# Patient Record
Sex: Female | Born: 1975 | Race: Black or African American | Hispanic: No | Marital: Single | Smoking: Never smoker
Health system: Southern US, Community
[De-identification: ages and names within clinical notes are randomized; demographics above are authoritative.]

## PROBLEM LIST (undated history)

## (undated) DIAGNOSIS — I10 Essential (primary) hypertension: Secondary | ICD-10-CM

## (undated) DIAGNOSIS — F419 Anxiety disorder, unspecified: Secondary | ICD-10-CM

## (undated) DIAGNOSIS — F32A Depression, unspecified: Secondary | ICD-10-CM

## (undated) DIAGNOSIS — J45909 Unspecified asthma, uncomplicated: Secondary | ICD-10-CM

## (undated) DIAGNOSIS — T7840XA Allergy, unspecified, initial encounter: Secondary | ICD-10-CM

## (undated) DIAGNOSIS — F329 Major depressive disorder, single episode, unspecified: Secondary | ICD-10-CM

## (undated) HISTORY — PX: OTHER SURGICAL HISTORY: SHX169

## (undated) HISTORY — DX: Depression, unspecified: F32.A

## (undated) HISTORY — DX: Allergy, unspecified, initial encounter: T78.40XA

## (undated) HISTORY — DX: Major depressive disorder, single episode, unspecified: F32.9

## (undated) HISTORY — DX: Anxiety disorder, unspecified: F41.9

## (undated) HISTORY — DX: Unspecified asthma, uncomplicated: J45.909

---

## 2013-05-27 ENCOUNTER — Emergency Department (HOSPITAL_COMMUNITY)
Admission: EM | Admit: 2013-05-27 | Discharge: 2013-05-27 | Disposition: A | Discharge status: Home / Self Care | Payer: 59 | Attending: Emergency Medicine | Admitting: Emergency Medicine

## 2013-05-27 ENCOUNTER — Emergency Department (HOSPITAL_COMMUNITY): Payer: 59

## 2013-05-27 ENCOUNTER — Encounter (HOSPITAL_COMMUNITY): Payer: Self-pay | Admitting: Emergency Medicine

## 2013-05-27 DIAGNOSIS — R269 Unspecified abnormalities of gait and mobility: Secondary | ICD-10-CM | POA: Insufficient documentation

## 2013-05-27 DIAGNOSIS — Z79899 Other long term (current) drug therapy: Secondary | ICD-10-CM | POA: Insufficient documentation

## 2013-05-27 DIAGNOSIS — R51 Headache: Secondary | ICD-10-CM | POA: Insufficient documentation

## 2013-05-27 DIAGNOSIS — R42 Dizziness and giddiness: Secondary | ICD-10-CM

## 2013-05-27 DIAGNOSIS — Z3202 Encounter for pregnancy test, result negative: Secondary | ICD-10-CM | POA: Insufficient documentation

## 2013-05-27 DIAGNOSIS — R112 Nausea with vomiting, unspecified: Secondary | ICD-10-CM | POA: Insufficient documentation

## 2013-05-27 DIAGNOSIS — F411 Generalized anxiety disorder: Secondary | ICD-10-CM | POA: Insufficient documentation

## 2013-05-27 DIAGNOSIS — Z8739 Personal history of other diseases of the musculoskeletal system and connective tissue: Secondary | ICD-10-CM | POA: Insufficient documentation

## 2013-05-27 MED ORDER — METOCLOPRAMIDE HCL 5 MG/ML IJ SOLN
10.0000 mg | Freq: Once | INTRAMUSCULAR | Status: AC
  Administered 2013-05-27: 10 mg via INTRAVENOUS
  Filled 2013-05-27: qty 2

## 2013-05-27 MED ORDER — DEXAMETHASONE SODIUM PHOSPHATE 10 MG/ML IJ SOLN
10.0000 mg | Freq: Once | INTRAMUSCULAR | Status: AC
  Administered 2013-05-27: 10 mg via INTRAVENOUS
  Filled 2013-05-27: qty 1

## 2013-05-27 MED ORDER — SODIUM CHLORIDE 0.9 % IV BOLUS (SEPSIS)
1000.0000 mL | Freq: Once | INTRAVENOUS | Status: AC
  Administered 2013-05-27: 1000 mL via INTRAVENOUS

## 2013-05-27 MED ORDER — DIPHENHYDRAMINE HCL 50 MG/ML IJ SOLN
25.0000 mg | Freq: Once | INTRAMUSCULAR | Status: AC
  Administered 2013-05-27: 25 mg via INTRAVENOUS
  Filled 2013-05-27: qty 1

## 2013-05-27 NOTE — Progress Notes (Addendum)
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 Reports she recently moved her and was recommended a provider by her sister but the "two african american" providers did not have an appointment opening until "July 2015"  [facility] ED CM spoke with pt on how to obtain an in network pcp with insurance coverage via the customer service number or web site no matter where she relocates  CM encouraged pt and discussed pt's responsibility to verify with pt's insurance carrier that any recommended medical provider offered by any emergency room or a hospital provider is within the carrier's network. The pt voiced understanding  [facility] ED CM consulted by [facility] ED patient advocate about inquiry about seeing a therapist or psychiatrist during this ED visit Pt in with c/o headache, dizziness Cm offered pt resources  [facility] the [place] will fill [facility] patients medications + [facility] [phone] + [facility] [phone]  And an information sheet provided by [employer] member for [employer] outpatient mental health counselors, therapists and psychiatrists

## 2013-05-27 NOTE — ED Provider Notes (Signed)
CSN: [id number]     Arrival date & time 05/27/13  1334 History     First MD Initiated Contact with Patient 05/27/13 1516     Chief Complaint  Patient presents with  . Dizziness  . Headache    HPI  Victoria Ewing is a 37 year old female with a PMH of arthritis who presents to the ED for evaluation of dizziness and a headache.  Patient states that yesterday around 4:00 pm she suddenly developed dizziness while driving.  She states that she felt so dizzy that she had to pull over.  She describes her dizziness as "the room is spinning" with no lightheadedness.  She states her dizziness gradually improved and she was able to drive to her daughter's daycare center.  She states when she got out of the car she felt unsteady due to her dizziness.  She stayed at the daycare center until her dizziness resolved enough for her to drive home.  She felt nauseated when she was dizzy and had one episode of self-inflicted emesis.  Her dizziness lasted about 20 minutes.  No lightheadedness, vision changes, neck pain, numbness/tingling, syncope, loss of sensation/weakness. No head trauma/injury or syncope. Patient states that today she developed a headache.  Her headache is described as a dull pain in her forehead radiation back across the top of her head.  Her pain is constant and rated a 6/10.  She states she has a history of headaches in the past and was evaluated by a neurologist in 2012 and her work-up was negative.  She hasn't had headaches since.  She states she has been very anxious lately trying to take care of her mom.  She was recently started on Xanax in mid-July.  She states she gets very anxious with driving.  She otherwise has had no fever, chills, change in appetite/activity, rhinorrhea, congestion, sore throat, SOB, chest pain, palpitations, cough, abdominal pain, diarrhea, constipation, dysuria, or leg edema.      Past Medical History  Diagnosis Date  . Arthritis    Past Surgical History   Procedure Laterality Date  . Cesarean section     No family history on file. History  Substance Use Topics  . Smoking status: Never Smoker   . Smokeless tobacco: Not on file  . Alcohol Use: No   OB History   Grav Para Term Preterm Abortions TAB SAB Ect Mult Living                 Review of Systems  Constitutional: Negative for fever, chills, activity change, appetite change and fatigue.  HENT: Negative for ear pain, congestion, sore throat, rhinorrhea, neck pain and neck stiffness.   Eyes: Negative for photophobia and visual disturbance.  Respiratory: Negative for cough, shortness of breath and wheezing.   Cardiovascular: Negative for chest pain and leg swelling.  Gastrointestinal: Positive for nausea (resolved) and vomiting (resolved). Negative for abdominal pain, diarrhea and constipation.  Genitourinary: Negative for dysuria.  Musculoskeletal: Positive for gait problem (unsteady - resolved). Negative for back pain.  Skin: Negative for wound.  Neurological: Positive for dizziness (resolved) and headaches. Negative for syncope, facial asymmetry, weakness, light-headedness and numbness.  Psychiatric/Behavioral: Negative for confusion.    Allergies  Other  Home Medications   Current Outpatient Rx  Name  Route  Sig  Dispense  Refill  . albuterol (PROVENTIL HFA;VENTOLIN HFA) 108 (90 BASE) MCG/ACT inhaler   Inhalation   Inhale 2 puffs into the lungs every 6 (six) hours as needed  for wheezing.         Marland Kitchen ALPRAZolam (XANAX) 0.25 MG tablet   Oral   Take 0.25 mg by mouth at bedtime as needed for sleep.         . cholecalciferol (VITAMIN D) 1000 UNITS tablet   Oral   Take 1,000 Units by mouth daily.         Marland Kitchen loratadine (CLARITIN) 10 MG tablet   Oral   Take 10 mg by mouth daily as needed for allergies.          BP 132/88  Pulse 81  Temp(Src) 98.8 F (37.1 C) (Oral)  Resp 20  SpO2 100%  LMP 05/18/2013  Filed Vitals:   05/27/13 1354 05/27/13 1658  BP:  132/88 130/66  Pulse: 81 72  Temp: 98.8 F (37.1 C)   TempSrc: Oral   Resp: 20 14  SpO2: 100% 100%    Physical Exam  Nursing note and vitals reviewed. Constitutional: She is oriented to person, place, and time. She appears well-developed and well-nourished. No distress.  HENT:  Head: Normocephalic and atraumatic.  Right Ear: External ear normal.  Left Ear: External ear normal.  Nose: Nose normal.  Mouth/Throat: Oropharynx is clear and moist. No oropharyngeal exudate.  TM's gray and translucent bilaterally.  No nystagmus bilaterally  Eyes: Conjunctivae and EOM are normal. Pupils are equal, round, and reactive to light. Left eye exhibits no discharge.  Neck: Normal range of motion. Neck supple.  Cardiovascular: Normal rate, regular rhythm, normal heart sounds and intact distal pulses.  Exam reveals no gallop and no friction rub.   No murmur heard. Pulmonary/Chest: Effort normal and breath sounds normal. No respiratory distress. She has no wheezes. She has no rales. She exhibits no tenderness.  Abdominal: Soft. Bowel sounds are normal. She exhibits no distension and no mass. There is no tenderness. There is no rebound and no guarding.  Musculoskeletal: Normal range of motion. She exhibits no edema and no tenderness.  Neurological: She is alert and oriented to person, place, and time.  No focal cranial nerve deficits.  No focal neurological deficits.  Gross sensation intact bilaterally in the upper and lower extremities.  Negative Romberg.  No ataxia or difficulty with ambulation.  Finger to nose intact.  Skin: Skin is warm and dry. She is not diaphoretic.    ED Course   Procedures (including critical care time)  Labs Reviewed - No data to display No results found. 1. Headache   2. Dizziness      CT Head Wo Contrast (Final result)  Result time: 05/27/13 16:16:23    Final result by Rad Results In Interface (05/27/13 16:16:23)    Narrative:   CLINICAL DATA: Dizziness,  headache.  EXAM: CT HEAD WITHOUT CONTRAST  TECHNIQUE: Contiguous axial images were obtained from the base of the skull through the vertex without intravenous contrast.  COMPARISON: None.  FINDINGS: No acute intracranial abnormality. Specifically, no hemorrhage, hydrocephalus, mass lesion, acute infarction, or significant intracranial injury. No acute calvarial abnormality. Visualized paranasal sinuses and mastoids clear. Orbital soft tissues unremarkable.  IMPRESSION: Normal study   Electronically Signed By: Charlett Nose On: 05/27/2013 16:16    Results for orders placed during the hospital encounter of 05/27/13  POCT PREGNANCY, URINE      Result Value Range   Preg Test, Ur NEGATIVE  NEGATIVE    MDM  Victoria Ewing is a 37 year old female with a PMH of arthritis who presents to the ED for evaluation of  dizziness and a headache.  Head CT ordered to further evaluate.  Decadron, Benadryl, and Reglan ordered.  Rechecks  5:01 PM = Patient resting in the dark.  States her headache is much better.  Will recheck.  No dizziness.  5:23 PM = Patient states "my headache is gone" and is ready for discharge    Etiology of dizziness possibly due to benign paroxysmal positional vertigo.  Symptoms may also be anxiety related as patient states that she becomes very anxious when she drives.  Patient had no complaints of dizziness throughout her emergency department visit. She had no focal neurological deficits on exam. Her head CT was negative for an acute intracranial process. Her headache resolved throughout her emergency department visit with Decadron, Benadryl, and Reglan.  Patient remained in no acute distress throughout her ED visit. She does instructed to followup with her primary care provider regarding her headaches and dizziness. She was instructed to drink fluids and rest.  She is instructed to return to the emergency department if she develops a severe/worsening headache, neck  stiffness, repeated vomiting, dizziness, weakness, loss of sensation, numbness, tingling, fever or other concerns. She is in agreement with discharge plan. She is obtaining a ride home from the emergency department.     Final impressions: 1. Headache, resolved  2. Dizziness, resolved    Luiz Iron PA-C   Jillyn Ledger, PA-C 05/27/13 2053
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
## 2013-05-27 NOTE — ED Notes (Signed)
Patient has had history of anxiety, and reports she has had two panic attacks in the last two weeks.  She was prescribed Xanax by her doctor in [place] since mid July but has not seen an MD in [place] yet.  Patient normally just takes one at bedtime and only occasionally takes one in the morning.  Patient reports she has had some dizziness before but the episode yesterday felt different in that it occurred so suddenly.  She also had some nausea, and induced vomiting to see if it would make her feel better.

## 2013-05-28 ENCOUNTER — Telehealth (HOSPITAL_COMMUNITY): Payer: Self-pay | Admitting: Emergency Medicine
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
## 2013-05-30 ENCOUNTER — Ambulatory Visit (INDEPENDENT_AMBULATORY_CARE_PROVIDER_SITE_OTHER): Payer: 59 | Admitting: Family Medicine

## 2013-05-30 VITALS — BP 110/70 | HR 83 | Temp 98.1°F | Resp 18 | Ht 70.0 in | Wt 257.0 lb

## 2013-05-30 DIAGNOSIS — F449 Dissociative and conversion disorder, unspecified: Secondary | ICD-10-CM

## 2013-05-30 DIAGNOSIS — F4323 Adjustment disorder with mixed anxiety and depressed mood: Secondary | ICD-10-CM

## 2013-05-30 MED ORDER — CLONAZEPAM 0.5 MG PO TABS: 0.5000 mg | ORAL_TABLET | Freq: Every morning | ORAL | Status: DC

## 2013-05-30 MED ORDER — CITALOPRAM HYDROBROMIDE 20 MG PO TABS: 20.0000 mg | ORAL_TABLET | Freq: Every day | ORAL | Status: DC

## 2013-05-30 NOTE — Progress Notes (Signed)

## 2013-05-30 NOTE — Progress Notes (Signed)
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  She has had two panic attacks recently, and was seen at [facility] ED for dizzy spell.  She was denied services by [facility]  Despite being directed to the ED. She also took an OTC for migraine which made her nauseated. The low dose xanax originally helped her. Her mother lives here and she has been hospitalized for pancreatitis.  She has a 16 month child (girl) Sleep:  Takes longer to fall asleep. Has been to one therapy session.  Objective: NAD Alert, appropriate. Recently had CT which was normal.  Neurologic and general physical exam was normal at ED three days ago.  Assessment:  Adjustment disorder  Plan:  Continue the counseling. Clonazepam 0.5 mg in am Citalopram 20 mg at hs.

## 2013-05-30 NOTE — Patient Instructions (Signed)
Vonna Kotyk or [place] would be good therapists.

## 2013-06-02 ENCOUNTER — Encounter (INDEPENDENT_AMBULATORY_CARE_PROVIDER_SITE_OTHER): Payer: 59

## 2013-06-02 ENCOUNTER — Telehealth: Payer: Self-pay

## 2013-06-02 DIAGNOSIS — Z111 Encounter for screening for respiratory tuberculosis: Secondary | ICD-10-CM
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
## 2013-06-03 NOTE — Telephone Encounter (Signed)
Advise pt to use Klonopin once or twice a day as needed for anxiety until she is seen by me. Do not take Celexa (citalopram) for now. May have to try this med at a lower dose but we will discuss. Continue therapy.

## 2013-06-03 NOTE — Telephone Encounter (Signed)
Dr Leonia Reader please advise

## 2013-06-03 NOTE — Telephone Encounter (Signed)
Spoke with pt advised message from Dr Audria Nine. Pt understood.

## 2013-06-10 ENCOUNTER — Encounter: Payer: Self-pay | Admitting: Family Medicine

## 2013-06-10 ENCOUNTER — Ambulatory Visit (INDEPENDENT_AMBULATORY_CARE_PROVIDER_SITE_OTHER): Payer: 59 | Admitting: Family Medicine

## 2013-06-10 VITALS — BP 143/78 | HR 93 | Temp 98.4°F | Resp 16 | Ht 69.75 in | Wt 264.0 lb

## 2013-06-10 DIAGNOSIS — Z131 Encounter for screening for diabetes mellitus: Secondary | ICD-10-CM

## 2013-06-10 DIAGNOSIS — F411 Generalized anxiety disorder: Secondary | ICD-10-CM

## 2013-06-10 DIAGNOSIS — Z569 Unspecified problems related to employment: Secondary | ICD-10-CM

## 2013-06-10 DIAGNOSIS — Z566 Other physical and mental strain related to work: Secondary | ICD-10-CM

## 2013-06-10 DIAGNOSIS — F410 Panic disorder [episodic paroxysmal anxiety] without agoraphobia: Secondary | ICD-10-CM

## 2013-06-10 MED ORDER — ESCITALOPRAM OXALATE 10 MG PO TABS: ORAL_TABLET | ORAL | Status: DC

## 2013-06-10 NOTE — Patient Instructions (Addendum)
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
## 2013-07-22 ENCOUNTER — Ambulatory Visit (INDEPENDENT_AMBULATORY_CARE_PROVIDER_SITE_OTHER): Payer: BC Managed Care – PPO | Admitting: Family Medicine

## 2013-07-22 ENCOUNTER — Encounter: Payer: Self-pay | Admitting: Family Medicine

## 2013-07-22 VITALS — BP 130/82 | HR 87 | Temp 98.7°F | Resp 16 | Ht 69.0 in | Wt 259.4 lb

## 2013-07-22 DIAGNOSIS — F411 Generalized anxiety disorder: Secondary | ICD-10-CM

## 2013-07-22 DIAGNOSIS — J309 Allergic rhinitis, unspecified: Secondary | ICD-10-CM | POA: Insufficient documentation

## 2013-07-22 DIAGNOSIS — Z6379 Other stressful life events affecting family and household: Secondary | ICD-10-CM

## 2013-07-22 DIAGNOSIS — J019 Acute sinusitis, unspecified: Secondary | ICD-10-CM

## 2013-07-22 MED ORDER — CEFUROXIME AXETIL 500 MG PO TABS: 500.0000 mg | ORAL_TABLET | Freq: Two times a day (BID) | ORAL | Status: DC

## 2013-07-22 NOTE — Progress Notes (Signed)
S:  This 37 y.o. AA female returns for follow-up of GAD; she is in counseling w/ a therapist who feels confident that he can help her with her driving phobia. Pt has no anxiety related to be a passenger in a vehicle. She relies on family members for transportation. Lexapro generic does not seem to be causing a noticeable difference; some days she feels a little lightheaded but is less anxious. She has used clonazepam 2-3 times. Since last visit, her mother has been diagnosed w/ pancreatic cancer; this news has been stressful for pt to absorb and cope with as the primary caregiver.  Pt c/o nasal congestion w/ fever and severe frontal HA within the last week. Fever and HA have subsided but nasal drainage is thick and yellow. Her eyes have been itching and L eye is red and swollen. Seasonal allergies are worse; she has been taking OTC Loratidine prn.  Patient Active Problem List   Diagnosis Date Noted  . Anxiety state, unspecified 07/22/2013  . Allergic rhinitis 07/22/2013   PMHx, Soc Hx and Fam Hx reviewed. Medications reconciled.  ROS: As per HPI; otherwise negative for diaphoresis, CP or tightness, cough, n/v/d, weakness, behavior changes, agitation, confusion, thoughts of self-harm, SI/HI.  O: Filed Vitals:   07/22/13 1636  BP: 130/82  Pulse: 87  Temp: 98.7 F (37.1 C)  Resp: 16   GEN: In NAD; WN,WD. HEENT: [place]/AT; EOMI w/ clear conj/scl on R, injected conj on L. EACs/TMs normal. Nasal mucosa erythematous and boggy. Post ph w/ mild erythema w/o exudate. Sinuses- NT w/ percussion. NECK: Supple w/o LAN. COR: RRR. LUNGS: Normal resp rate and effort. SKIN: W&D; intact w/o erythema, rashes or lesions. NEURO: A&O x 3; CNs intact. Nonfocal. PSYCH: Pleasant, conversant and attentive. Speech pattern and thought content are normal. Judgement is sound.  A/P: Sinusitis, acute- RX: Cefuroxime 500 mg 1 tablet bid x 10 days. Try OTC AYR nasal mist for allergy symptoms. Take Loratidine  daily.  Stress due to illness of family member- Continue w/ counseling, especially in light of new medical problems with mother.  Anxiety state, unspecified- Increase Escitalopram 10 mg from 1 tablet daily to 1 1/2 tablets every morning. Try increased dose for a few weeks; decrease back to 1 tablet if nausea, fatigue, sleep disturbance or any other symptoms emerge.  Meds ordered this encounter  Medications  . cefUROXime (CEFTIN) 500 MG tablet    Sig: Take 1 tablet (500 mg total) by mouth 2 (two) times daily.    Dispense:  20 tablet    Refill:  0

## 2013-07-22 NOTE — Patient Instructions (Signed)

## 2013-08-02 ENCOUNTER — Other Ambulatory Visit: Payer: Self-pay | Admitting: Family Medicine

## 2013-09-22 ENCOUNTER — Telehealth: Payer: Self-pay

## 2013-09-22 NOTE — Telephone Encounter (Signed)
Patient is calling to speak with Dr. Audria Nine about FMLA ppw. States that they discussed this some during her last OV however more things have taken place since then that have led patient to want to take a leave of absence from work.   [phone]

## 2013-09-23 NOTE — Telephone Encounter (Signed)
Left message for her to call back, what are the things she is referring to?

## 2013-10-18 ENCOUNTER — Ambulatory Visit (INDEPENDENT_AMBULATORY_CARE_PROVIDER_SITE_OTHER): Payer: BC Managed Care – PPO | Admitting: Family Medicine

## 2013-10-18 ENCOUNTER — Encounter: Payer: Self-pay | Admitting: Family Medicine

## 2013-10-18 VITALS — BP 120/84 | HR 76 | Temp 99.1°F | Resp 16 | Ht 69.5 in | Wt 271.2 lb

## 2013-10-18 DIAGNOSIS — F411 Generalized anxiety disorder: Secondary | ICD-10-CM

## 2013-10-18 DIAGNOSIS — F4321 Adjustment disorder with depressed mood: Secondary | ICD-10-CM

## 2013-10-18 MED ORDER — ALPRAZOLAM 0.25 MG PO TBDP: 0.2500 mg | ORAL_TABLET | Freq: Every evening | ORAL | Status: DC | PRN

## 2013-10-18 MED ORDER — ALPRAZOLAM 0.5 MG PO TABS: ORAL_TABLET | ORAL | Status: DC

## 2013-10-19 NOTE — Telephone Encounter (Signed)
Spoke with patient regarding FMLA paperwork is ready for pickup. Patient stated she will pick up a copy for herself on 10/20/12 at 104, she would also like papers to be faxed to [employer], papers have been faxed to employer.
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
## 2013-11-30 ENCOUNTER — Ambulatory Visit: Payer: BC Managed Care – PPO | Admitting: Family Medicine

## 2013-12-06 ENCOUNTER — Ambulatory Visit (INDEPENDENT_AMBULATORY_CARE_PROVIDER_SITE_OTHER): Payer: BC Managed Care – PPO | Admitting: Family Medicine

## 2013-12-06 ENCOUNTER — Encounter: Payer: Self-pay | Admitting: Family Medicine

## 2013-12-06 VITALS — BP 126/88 | HR 88 | Temp 99.7°F | Resp 16 | Ht 69.5 in | Wt 270.0 lb

## 2013-12-06 DIAGNOSIS — J029 Acute pharyngitis, unspecified: Secondary | ICD-10-CM

## 2013-12-06 DIAGNOSIS — F411 Generalized anxiety disorder: Secondary | ICD-10-CM

## 2013-12-06 DIAGNOSIS — J309 Allergic rhinitis, unspecified: Secondary | ICD-10-CM

## 2013-12-06 NOTE — Progress Notes (Signed)
S:  This 38 y.o. AA female is here for follow-up of anxiety; she has been out of work on [employer] following her mother's death. She did reach out to Hospice and has been receiving in-home counseling. This has been very helpful; pt hopes to be able to attend group sessions and she will participate in the Mother's Day project to honor her mother. Pt still plans to move back to [place] in the future. Pt is ready to return to work on 12/19/2013 and needs a letter to state this.  Pt c/o sore throat, onset after recent travel to [place]. She reports that the home where she and her daughter stayed was "very cold"; she has not has exposure to illness. She has felt "warm" but no reported fever/ chills. Neck glands have been tender and phlegm makes swallowing difficult. OTC product for cold symptoms was minimally effective. Cough is nonprod and there is no nasal drainage. Pt has seasonal allergies and takes Loratidine every other day or prn.  Patient Active Problem List   Diagnosis Date Noted  . Anxiety state, unspecified 07/22/2013  . Allergic rhinitis 07/22/2013   Prior to Admission medications   Medication Sig Start Date End Date Taking? Authorizing Provider  albuterol (PROVENTIL HFA;VENTOLIN HFA) 108 (90 BASE) MCG/ACT inhaler Inhale 2 puffs into the lungs every 6 (six) hours as needed for wheezing.   Yes Historical Provider, MD  ALPRAZolam Duanne Moron) 0.5 MG tablet Take 1/2 - 1 tablet twice a day as needed for anxiety. 10/18/13  Yes Barton Fanny, MD  cholecalciferol (VITAMIN D) 1000 UNITS tablet Take 1,000 Units by mouth daily.   Yes Historical Provider, MD  loratadine (CLARITIN) 10 MG tablet Take 10 mg by mouth daily as needed for allergies.   Yes Historical Provider, MD   PMHx, Surg Hx, Soc and Fam Hx reviewed.  ROS: As per HPI. No SI or thoughts of self harm.  O: Filed Vitals:   12/06/13 1423  BP: 126/88  Pulse: 88  Temp: 99.7 F (37.6 C)  Resp: 16   GEN: In NAD: WN, WD. Does not appear ill  or sickly. HEENT: [place]/AT; EOMI w/ clear conj/sclerae. EACs/TMs normal. Nasal mucosa red and edematous. Post ph erythematous w/o swelling or lesions. Sinuses nontender. NECK: Supple w/o LAN or TMG; tender R anterior cervical node. COR: RRR. LUNGS: CTA; normal resp rate and effort. SKIN: W&D; intact w/o diaphoresis, erythema or rashes. NEURO: A&O x 3; CNs intact. Nonfocal. PSYCH: Pleasant and calm demeanor; good eye contact and attentive. Affect somewhat flat. Speech pattern and thought content normal. Judgement sound.   A/P: Allergic rhinitis- Continue Loratidine.  Acute pharyngitis- Symptomatic treatment; throat lozenges sample given as well as Mucinex-DM.  Anxiety state, unspecified- Letter for return to work given to pt.

## 2013-12-06 NOTE — Patient Instructions (Signed)
Allergic Rhinitis Allergic rhinitis is when the mucous membranes in the nose respond to allergens. Allergens are particles in the air that cause your body to have an allergic reaction. This causes you to release allergic antibodies. Through a chain of events, these eventually cause you to release histamine into the blood stream. Although meant to protect the body, it is this release of histamine that causes your discomfort, such as frequent sneezing, congestion, and an itchy, runny nose.  CAUSES  Seasonal allergic rhinitis (hay fever) is caused by pollen allergens that may come from grasses, trees, and weeds. Year-round allergic rhinitis (perennial allergic rhinitis) is caused by allergens such as house dust mites, pet dander, and mold spores.  SYMPTOMS   Nasal stuffiness (congestion).  Itchy, runny nose with sneezing and tearing of the eyes. DIAGNOSIS  Your health care provider can help you determine the allergen or allergens that trigger your symptoms. If you and your health care provider are unable to determine the allergen, skin or blood testing may be used. TREATMENT  Allergic Rhinitis does not have a cure, but it can be controlled by:  Medicines and allergy shots (immunotherapy).  Avoiding the allergen. Hay fever may often be treated with antihistamines in pill or nasal spray forms. Antihistamines block the effects of histamine. There are over-the-counter medicines that may help with nasal congestion and swelling around the eyes. Check with your health care provider before taking or giving this medicine.  If avoiding the allergen or the medicine prescribed do not work, there are many new medicines your health care provider can prescribe. Stronger medicine may be used if initial measures are ineffective. Desensitizing injections can be used if medicine and avoidance does not work. Desensitization is when a patient is given ongoing shots until the body becomes less sensitive to the allergen.  Make sure you follow up with your health care provider if problems continue. HOME CARE INSTRUCTIONS It is not possible to completely avoid allergens, but you can reduce your symptoms by taking steps to limit your exposure to them. It helps to know exactly what you are allergic to so that you can avoid your specific triggers. SEEK MEDICAL CARE IF:   You have a fever.  You develop a cough that does not stop easily (persistent).  You have shortness of breath.  You start wheezing.  Symptoms interfere with normal daily activities. Document Released: 06/17/2001 Document Revised: 07/13/2013 Document Reviewed: 05/30/2013 [facility] Patient Information 2014 [place].    Pharyngitis Pharyngitis is redness, pain, and swelling (inflammation) of your pharynx.  CAUSES  Pharyngitis is usually caused by infection. Most of the time, these infections are from viruses (viral) and are part of a cold. However, sometimes pharyngitis is caused by bacteria (bacterial). Pharyngitis can also be caused by allergies. Viral pharyngitis may be spread from person to person by coughing, sneezing, and personal items or utensils (cups, forks, spoons, toothbrushes). Bacterial pharyngitis may be spread from person to person by more intimate contact, such as kissing.  SIGNS AND SYMPTOMS  Symptoms of pharyngitis include:   Sore throat.   Tiredness (fatigue).   Low-grade fever.   Headache.  Joint pain and muscle aches.  Skin rashes.  Swollen lymph nodes.  Plaque-like film on throat or tonsils (often seen with bacterial pharyngitis). DIAGNOSIS  Your health care provider will ask you questions about your illness and your symptoms. Your medical history, along with a physical exam, is often all that is needed to diagnose pharyngitis. Sometimes, a rapid strep test  is done. Other lab tests may also be done, depending on the suspected cause.  TREATMENT  Viral pharyngitis will usually get better in 3 4 days  without the use of medicine. Bacterial pharyngitis is treated with medicines that kill germs (antibiotics).  HOME CARE INSTRUCTIONS   Drink enough water and fluids to keep your urine clear or pale yellow.   Only take over-the-counter or prescription medicines as directed by your health care provider:   If you are prescribed antibiotics, make sure you finish them even if you start to feel better.   Do not take aspirin.   Get lots of rest.   Gargle with 8 oz of salt water ( tsp of salt per 1 qt of water) as often as every 1 2 hours to soothe your throat.   Throat lozenges (if you are not at risk for choking) or sprays may be used to soothe your throat. SEEK MEDICAL CARE IF:   You have large, tender lumps in your neck.  You have a rash.  You cough up green, yellow-brown, or bloody spit. SEEK IMMEDIATE MEDICAL CARE IF:   Your neck becomes stiff.  You drool or are unable to swallow liquids.  You vomit or are unable to keep medicines or liquids down.  You have severe pain that does not go away with the use of recommended medicines.  You have trouble breathing (not caused by a stuffy nose). MAKE SURE YOU:   Understand these instructions.  Will watch your condition.  Will get help right away if you are not doing well or get worse. Document Released: 09/22/2005 Document Revised: 07/13/2013 Document Reviewed: 05/30/2013 [facility] Patient Information 2014 [place].

## 2014-02-21 ENCOUNTER — Other Ambulatory Visit: Payer: Self-pay | Admitting: Family Medicine

## 2014-02-21 NOTE — Telephone Encounter (Signed)
Alprazolam refill phoned to pt's pharmacy. 

## 2014-05-29 ENCOUNTER — Other Ambulatory Visit: Payer: Self-pay | Admitting: Family Medicine

## 2014-05-31 NOTE — Telephone Encounter (Signed)
Alprazolam refill phoned to pt's pharmacy. 

## 2014-08-29 ENCOUNTER — Ambulatory Visit (INDEPENDENT_AMBULATORY_CARE_PROVIDER_SITE_OTHER): Payer: BC Managed Care – PPO | Admitting: Physician Assistant

## 2014-08-29 VITALS — BP 120/80 | HR 84 | Temp 99.0°F | Ht 69.0 in | Wt 291.2 lb

## 2014-08-29 DIAGNOSIS — J4520 Mild intermittent asthma, uncomplicated: Secondary | ICD-10-CM

## 2014-08-29 DIAGNOSIS — F410 Panic disorder [episodic paroxysmal anxiety] without agoraphobia: Secondary | ICD-10-CM

## 2014-08-29 DIAGNOSIS — F411 Generalized anxiety disorder: Secondary | ICD-10-CM

## 2014-08-29 DIAGNOSIS — R059 Cough, unspecified: Secondary | ICD-10-CM

## 2014-08-29 DIAGNOSIS — L723 Sebaceous cyst: Secondary | ICD-10-CM

## 2014-08-29 DIAGNOSIS — R05 Cough: Secondary | ICD-10-CM

## 2014-08-29 MED ORDER — GUAIFENESIN ER 1200 MG PO TB12: 1.0000 | ORAL_TABLET | Freq: Two times a day (BID) | ORAL | Status: DC | PRN

## 2014-08-29 MED ORDER — ALPRAZOLAM 0.5 MG PO TABS: ORAL_TABLET | ORAL | Status: DC

## 2014-08-29 MED ORDER — ALBUTEROL SULFATE HFA 108 (90 BASE) MCG/ACT IN AERS
2.0000 | INHALATION_SPRAY | Freq: Four times a day (QID) | RESPIRATORY_TRACT | Status: DC | PRN
Start: 2014-08-29 — End: 2015-07-10

## 2014-08-29 MED ORDER — HYDROCOD POLST-CHLORPHEN POLST 10-8 MG/5ML PO LQCR
5.0000 mL | Freq: Two times a day (BID) | ORAL | Status: DC | PRN
Start: 2014-08-29 — End: 2014-09-26

## 2014-08-29 NOTE — Progress Notes (Signed)
Subjective:    Patient ID: Victoria Ewing, female    DOB: [birth year], 38 y.o.   MRN: [id number]  HPI  This is a 38 year old female with PMH mild intermittent asthma and anxiety who is presenting with cough x 1 week. Her cough is occasionally productive of a small amount of white mucus. She reports she is having trouble sleeping due to coughing. She has had a small amount of wheezing. She noted wheezing a couple days ago and realized her albuterol was expired and so did not use it. She has tried mucinex DM and tylenol sinus and congestion with some relief.  She denies nasal congestion, sore throat, fever, chills, otalgia or SOB. She has a two year old at home who has a cold.  Anxiety: She is prescribed alprazolam by Dr. Leward Quan. She is running out of her prescription. She was unable to get an appointment with her today.   Lesion on back: she notes for 1 year she has had a spot on her back that is not painful. She thinks it has grown some in the past year. She states when she first noticed it she was seen in [place]. They gave her antibiotics although she states it was never infected, red or painful.   Review of Systems  Constitutional: Negative for fever and chills.  HENT: Positive for sinus pressure. Negative for congestion, ear pain and sore throat.   Eyes: Negative.   Respiratory: Positive for cough and wheezing. Negative for shortness of breath.   Gastrointestinal: Negative for nausea, vomiting and diarrhea.  Skin: Negative for rash.       Spot on back  Psychiatric/Behavioral: The patient is nervous/anxious.    Patient Active Problem List   Diagnosis Date Noted  . Asthma, chronic 08/30/2014  . Anxiety state 07/22/2013  . Allergic rhinitis 07/22/2013   Prior to Admission medications   Medication Sig Start Date End Date Taking? Authorizing Provider  albuterol (PROVENTIL HFA;VENTOLIN HFA) 108 (90 BASE) MCG/ACT inhaler Inhale 2 puffs into the lungs every 6 (six) hours as needed  for wheezing. 08/29/14  Yes Bennett Scrape V, PA-C  ALPRAZolam (XANAX) 0.5 MG tablet TAKE 1/2-1 TABLET BY MOUTH TWICE DAILY AS NEEDEED FOR ANXIETY. 08/29/14  Yes Bennett Scrape V, PA-C  loratadine (CLARITIN) 10 MG tablet Take 10 mg by mouth daily as needed for allergies.   Yes Historical Provider, MD         cholecalciferol (VITAMIN D) 1000 UNITS tablet Take 1,000 Units by mouth daily.    Historical Provider, MD          Allergies  Allergen Reactions  . Other Anaphylaxis    Pecans and nuts        Objective:   Physical Exam  Constitutional: She is oriented to person, place, and time. She appears well-developed and well-nourished. No distress.  HENT:  Head: Normocephalic and atraumatic.  Right Ear: Hearing, tympanic membrane, external ear and ear canal normal.  Left Ear: Hearing, tympanic membrane, external ear and ear canal normal.  Nose: Nose normal.  Mouth/Throat: Uvula is midline, oropharynx is clear and moist and mucous membranes are normal.  Eyes: Conjunctivae and lids are normal. Right eye exhibits no discharge. Left eye exhibits no discharge. No scleral icterus.  Cardiovascular: Normal rate, regular rhythm, normal heart sounds, intact distal pulses and normal pulses.   No murmur heard. Pulmonary/Chest: Effort normal and breath sounds normal. No respiratory distress. She has no wheezes. She has no rhonchi. She has no  rales.  Musculoskeletal: Normal range of motion.  Lymphadenopathy:       Head (right side): No submental, no submandibular, no tonsillar, no preauricular, no posterior auricular and no occipital adenopathy present.       Head (left side): No submental, no submandibular, no tonsillar, no preauricular, no posterior auricular and no occipital adenopathy present.    She has no cervical adenopathy.  Neurological: She is alert and oriented to person, place, and time.  Skin: Skin is warm, dry and intact.  1 cm raised firm flesh colored lesion. No clear central pore noted. No  erythema or signs of infection.  Psychiatric: She has a normal mood and affect. Her speech is normal and behavior is normal. Thought content normal.      Assessment & Plan:  1. Panic attacks 2. Anxiety state Gave 15 pills until she can make appt with Dr. Leward Quan. - ALPRAZolam Duanne Moron) 0.5 MG tablet; TAKE 1/2-1 TABLET BY MOUTH TWICE DAILY AS NEEDEED FOR ANXIETY.  Dispense: 15 tablet; Refill: 0  3. Asthma, chronic, mild intermittent, uncomplicated Refilled albuterol. - albuterol (PROVENTIL HFA;VENTOLIN HFA) 108 (90 BASE) MCG/ACT inhaler; Inhale 2 puffs into the lungs every 6 (six) hours as needed for wheezing.  Dispense: 1 Inhaler; Refill: 3  4. Cough This is likely a viral illness. She will take tussionex for cough suppression and help with sleep. She will stop taking mucinex DM and start taking regular mucinex. She will return in 7-10 days if not improved. - chlorpheniramine-HYDROcodone (TUSSIONEX PENNKINETIC ER) 10-8 MG/5ML LQCR; Take 5 mLs by mouth every 12 (twelve) hours as needed for cough (cough).  Dispense: 100 mL; Refill: 0 - Guaifenesin (MUCINEX MAXIMUM STRENGTH) 1200 MG TB12; Take 1 tablet (1,200 mg total) by mouth every 12 (twelve) hours as needed.  Dispense: 14 tablet; Refill: 1  5. Sebaceous cyst Spot on back is a sebaceous cyst. We discussed what the procedure for excision entails. She will think this over and return to walk-in clinic if she decides she wants it removed.   Benjaman Pott Drenda Freeze, [facility]  08/30/2014

## 2014-08-29 NOTE — Patient Instructions (Signed)
Use tussionex at night to help sleep and suppress cough. Use regular mucinex to break up secretions. Drink 64 oz water a day with this. Your albuterol was refilled. Your xanax was refilled - you need to make an appt. With Dr. Leward Quan for more pills. If you decide you want the sebaceous cyst removed, we can do that here.

## 2014-08-30 DIAGNOSIS — J45909 Unspecified asthma, uncomplicated: Secondary | ICD-10-CM | POA: Insufficient documentation

## 2014-08-31 NOTE — Progress Notes (Signed)
The patient was discussed with me and I agree with the diagnosis and treatment plan.  

## 2014-09-18 ENCOUNTER — Other Ambulatory Visit: Payer: Self-pay | Admitting: Family Medicine

## 2014-09-18 NOTE — Telephone Encounter (Signed)
Alprazolam refilled one additional time; pharmacy to notify pt that she needs to schedule an OV.

## 2014-09-18 NOTE — Telephone Encounter (Signed)
Elmyra Ricks gave pt #[id number] on 08/29/14 to hold her until she could make appt w/you, but don't see one scheduled yet.

## 2014-09-26 ENCOUNTER — Ambulatory Visit (INDEPENDENT_AMBULATORY_CARE_PROVIDER_SITE_OTHER): Payer: BC Managed Care – PPO | Admitting: Family Medicine

## 2014-09-26 ENCOUNTER — Encounter: Payer: Self-pay | Admitting: Family Medicine

## 2014-09-26 VITALS — BP 112/74 | HR 95 | Temp 98.4°F | Resp 16 | Ht 69.25 in | Wt 286.8 lb

## 2014-09-26 DIAGNOSIS — F411 Generalized anxiety disorder: Secondary | ICD-10-CM

## 2014-09-26 DIAGNOSIS — E049 Nontoxic goiter, unspecified: Secondary | ICD-10-CM

## 2014-09-26 DIAGNOSIS — R0981 Nasal congestion: Secondary | ICD-10-CM

## 2014-09-26 DIAGNOSIS — E010 Iodine-deficiency related diffuse (endemic) goiter: Secondary | ICD-10-CM

## 2014-09-26 LAB — CBC WITH DIFFERENTIAL/PLATELET
BASOS PCT: 1 % (ref 0–1)
Basophils Absolute: 0.1 10*3/uL (ref 0.0–0.1)
EOS ABS: 0.5 10*3/uL (ref 0.0–0.7)
EOS PCT: 9 % — AB (ref 0–5)
HEMATOCRIT: 40.3 % (ref 36.0–46.0)
Hemoglobin: 12.7 g/dL (ref 12.0–15.0)
LYMPHS ABS: 1.9 10*3/uL (ref 0.7–4.0)
Lymphocytes Relative: 37 % (ref 12–46)
MCH: 27.7 pg (ref 26.0–34.0)
MCHC: 31.5 g/dL (ref 30.0–36.0)
MCV: 88 fL (ref 78.0–100.0)
MONOS PCT: 12 % (ref 3–12)
MPV: 9.8 fL (ref 9.4–12.4)
Monocytes Absolute: 0.6 10*3/uL (ref 0.1–1.0)
NEUTROS PCT: 41 % — AB (ref 43–77)
Neutro Abs: 2.1 10*3/uL (ref 1.7–7.7)
Platelets: 273 10*3/uL (ref 150–400)
RBC: 4.58 MIL/uL (ref 3.87–5.11)
RDW: 13.6 % (ref 11.5–15.5)
WBC: 5.1 10*3/uL (ref 4.0–10.5)

## 2014-09-26 LAB — COMPLETE METABOLIC PANEL WITH GFR
ALK PHOS: 45 U/L (ref 39–117)
ALT: 20 U/L (ref 0–35)
AST: 20 U/L (ref 0–37)
Albumin: 3.9 g/dL (ref 3.5–5.2)
BILIRUBIN TOTAL: 0.4 mg/dL (ref 0.2–1.2)
BUN: 13 mg/dL (ref 6–23)
CO2: 26 meq/L (ref 19–32)
CREATININE: 0.8 mg/dL (ref 0.50–1.10)
Calcium: 9.1 mg/dL (ref 8.4–10.5)
Chloride: 106 mEq/L (ref 96–112)
GLUCOSE: 100 mg/dL — AB (ref 70–99)
Potassium: 4.2 mEq/L (ref 3.5–5.3)
SODIUM: 141 meq/L (ref 135–145)
TOTAL PROTEIN: 6.8 g/dL (ref 6.0–8.3)

## 2014-09-26 MED ORDER — ALPRAZOLAM 0.5 MG PO TABS: ORAL_TABLET | ORAL | Status: DC

## 2014-09-26 NOTE — Patient Instructions (Signed)
Upper Respiratory Infection, Adult An upper respiratory infection (URI) is also sometimes known as the common cold. The upper respiratory tract includes the nose, sinuses, throat, trachea, and bronchi. Bronchi are the airways leading to the lungs. Most people improve within 1 week, but symptoms can last up to 2 weeks. A residual cough may last even longer.  CAUSES Many different viruses can infect the tissues lining the upper respiratory tract. The tissues become irritated and inflamed and often become very moist. Mucus production is also common. A cold is contagious. You can easily spread the virus to others by oral contact. This includes kissing, sharing a glass, coughing, or sneezing. Touching your mouth or nose and then touching a surface, which is then touched by another person, can also spread the virus. SYMPTOMS  Symptoms typically develop 1 to 3 days after you come in contact with a cold virus. Symptoms vary from person to person. They may include:  Runny nose.  Sneezing.  Nasal congestion.  Sinus irritation.  Sore throat.  Loss of voice (laryngitis).  Cough.  Fatigue.  Muscle aches.  Loss of appetite.  Headache.  Low-grade fever. DIAGNOSIS  You might diagnose your own cold based on familiar symptoms, since most people get a cold 2 to 3 times a year. Your caregiver can confirm this based on your exam. Most importantly, your caregiver can check that your symptoms are not due to another disease such as strep throat, sinusitis, pneumonia, asthma, or epiglottitis. Blood tests, throat tests, and X-rays are not necessary to diagnose a common cold, but they may sometimes be helpful in excluding other more serious diseases. Your caregiver will decide if any further tests are required. RISKS AND COMPLICATIONS  You may be at risk for a more severe case of the common cold if you smoke cigarettes, have chronic heart disease (such as heart failure) or lung disease (such as asthma), or if  you have a weakened immune system. The very young and very old are also at risk for more serious infections. Bacterial sinusitis, middle ear infections, and bacterial pneumonia can complicate the common cold. The common cold can worsen asthma and chronic obstructive pulmonary disease (COPD). Sometimes, these complications can require emergency medical care and may be life-threatening. PREVENTION  The best way to protect against getting a cold is to practice good hygiene. Avoid oral or hand contact with people with cold symptoms. Wash your hands often if contact occurs. There is no clear evidence that vitamin C, vitamin E, echinacea, or exercise reduces the chance of developing a cold. However, it is always recommended to get plenty of rest and practice good nutrition. TREATMENT  Treatment is directed at relieving symptoms. There is no cure. Antibiotics are not effective, because the infection is caused by a virus, not by bacteria. Treatment may include:  Increased fluid intake. Sports drinks offer valuable electrolytes, sugars, and fluids.  Breathing heated mist or steam (vaporizer or shower).  Eating chicken soup or other clear broths, and maintaining good nutrition.  Getting plenty of rest.  Using gargles or lozenges for comfort.  Controlling fevers with ibuprofen or acetaminophen as directed by your caregiver.  Increasing usage of your inhaler if you have asthma. Zinc gel and zinc lozenges, taken in the first 24 hours of the common cold, can shorten the duration and lessen the severity of symptoms. Pain medicines may help with fever, muscle aches, and throat pain. A variety of non-prescription medicines are available to treat congestion and runny nose. Your caregiver   can make recommendations and may suggest nasal or lung inhalers for other symptoms.  HOME CARE INSTRUCTIONS   Only take over-the-counter or prescription medicines for pain, discomfort, or fever as directed by your  caregiver.  Use a warm mist humidifier or inhale steam from a shower to increase air moisture. This may keep secretions moist and make it easier to breathe.  Drink enough water and fluids to keep your urine clear or pale yellow.  Rest as needed.  Return to work when your temperature has returned to normal or as your caregiver advises. You may need to stay home longer to avoid infecting others. You can also use a face mask and careful hand washing to prevent spread of the virus. SEEK MEDICAL CARE IF:   After the first few days, you feel you are getting worse rather than better.  You need your caregiver's advice about medicines to control symptoms.  You develop chills, worsening shortness of breath, or brown or red sputum. These may be signs of pneumonia.  You develop yellow or brown nasal discharge or pain in the face, especially when you bend forward. These may be signs of sinusitis.  You develop a fever, swollen neck glands, pain with swallowing, or white areas in the back of your throat. These may be signs of strep throat. SEEK IMMEDIATE MEDICAL CARE IF:   You have a fever.  You develop severe or persistent headache, ear pain, sinus pain, or chest pain.  You develop wheezing, a prolonged cough, cough up blood, or have a change in your usual mucus (if you have chronic lung disease).  You develop sore muscles or a stiff neck. Document Released: 03/18/2001 Document Revised: 12/15/2011 Document Reviewed: 12/28/2013 [facility] Patient Information 2015 [place], [place]. This information is not intended to replace advice given to you by your health care provider. Make sure you discuss any questions you have with your health care provider.   Best treatment for viral infection is: good restful sleep; plenty of fluids and good nutrition. You can continue to use OTC medication (Tylenol product is appropriate) and throat lozenges for sore throat. Try the AYR saline nasal product for  congestion.  A humidifier is helpful this time of year for adding back humidity into the air during the winter.   AIR PURIFIER helps clean the air if you have problems with allergies. Place one in the bedroom and you will have less problems waking up with sore throat and congestion.

## 2014-09-27 LAB — THYROID PANEL WITH TSH
FREE THYROXINE INDEX: 2 (ref 1.4–3.8)
T3 Uptake: 27 % (ref 22–35)
T4 TOTAL: 7.4 ug/dL (ref 4.5–12.0)
TSH: 1.133 u[IU]/mL (ref 0.350–4.500)

## 2014-09-30 ENCOUNTER — Encounter: Payer: Self-pay | Admitting: Family Medicine

## 2014-09-30 NOTE — Progress Notes (Signed)
Quick Note:  Please notify pt that results are normal.   Provide pt with copy of labs. ______ 
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
## 2014-10-25 ENCOUNTER — Other Ambulatory Visit: Payer: Self-pay | Admitting: Family Medicine

## 2014-11-12 ENCOUNTER — Ambulatory Visit (INDEPENDENT_AMBULATORY_CARE_PROVIDER_SITE_OTHER): Payer: BC Managed Care – PPO | Admitting: Physician Assistant

## 2014-11-12 VITALS — BP 142/74 | HR 89 | Temp 98.5°F | Resp 18 | Ht 69.5 in | Wt 291.4 lb

## 2014-11-12 DIAGNOSIS — L723 Sebaceous cyst: Secondary | ICD-10-CM

## 2014-11-12 DIAGNOSIS — L089 Local infection of the skin and subcutaneous tissue, unspecified: Secondary | ICD-10-CM

## 2014-11-12 MED ORDER — DOXYCYCLINE HYCLATE 100 MG PO CAPS: 100.0000 mg | ORAL_CAPSULE | Freq: Two times a day (BID) | ORAL | Status: DC

## 2014-11-12 NOTE — Progress Notes (Signed)
Subjective:    Patient ID: Victoria Ewing, female    DOB: 1976/05/09, 39 y.o.   MRN: [id number]  HPI  This is a 39 year old female presenting for with infected sebaceous cyst. She was here two months ago and she mentioned a spot on her back. I let her know it was a sebaceous cyst and we could remove it if it was bothersome to her. She decided she wanted to wait. Today she is returned stating that for 3 days this area has become very painful to the touch. She has had abscess before on her elbow that had to be I&D'd and packed. She has not tried anything for her symptoms. She denies fever, chills, N/V/D.   Review of Systems  Constitutional: Negative for fever and chills.  Gastrointestinal: Negative for nausea, vomiting and diarrhea.  Skin: Positive for color change.   Patient Active Problem List   Diagnosis Date Noted  . Asthma, chronic 08/30/2014  . Anxiety state 07/22/2013  . Allergic rhinitis 07/22/2013   Prior to Admission medications   Medication Sig Start Date End Date Taking? Authorizing Provider  albuterol (PROVENTIL HFA;VENTOLIN HFA) 108 (90 BASE) MCG/ACT inhaler Inhale 2 puffs into the lungs every 6 (six) hours as needed for wheezing. 08/29/14  Yes Bennett Scrape V, PA-C  ALPRAZolam (XANAX) 0.5 MG tablet Take 1/2- 1 tablet by mouth twice a day as needed for anxiety. 10/25/14  Yes Barton Fanny, MD  cholecalciferol (VITAMIN D) 1000 UNITS tablet Take 1,000 Units by mouth daily.   Yes Historical Provider, MD  loratadine (CLARITIN) 10 MG tablet Take 10 mg by mouth daily as needed for allergies.   Yes Historical Provider, MD          Allergies  Allergen Reactions  . Other Anaphylaxis    Pecans and nuts    Patient's social and family history were reviewed.     Objective:   Physical Exam  Constitutional: She is oriented to person, place, and time. She appears well-developed and well-nourished. No distress.  HENT:  Head: Normocephalic and atraumatic.  Right Ear:  Hearing normal.  Left Ear: Hearing normal.  Nose: Nose normal.  Eyes: Conjunctivae and lids are normal. Right eye exhibits no discharge. Left eye exhibits no discharge. No scleral icterus.  Cardiovascular: Normal rate, regular rhythm, intact distal pulses and normal pulses.   Pulmonary/Chest: Effort normal and breath sounds normal. No respiratory distress.  Musculoskeletal: Normal range of motion.  Neurological: She is alert and oriented to person, place, and time.  Skin: Skin is warm and dry.  3x3 cm area of induration and erythema with a medial fluctuance on her right upper back.  Psychiatric: She has a normal mood and affect. Her speech is normal and behavior is normal. Thought content normal.   BP 142/74 mmHg  Pulse 89  Temp(Src) 98.5 F (36.9 C) (Oral)  Resp 18  Ht 5' 9.5" (1.765 m)  Wt 291 lb 6.4 oz (132.178 kg)  BMI 42.43 kg/m2  SpO2 100%  LMP 10/24/2014  Procedure: Verbal consent obtained. Skin was cleaned with alcohol and anesthetized with 3 cc 1% lido with epi. A 1 cm incision was made through fluctuance however sebaceous cyst sac was only incised at the lateral edge without much visualization into entire sac. Another 1 cm incision was made perpendicular to previous incision. A moderate amount of purulent and sebaceous material expressed. Wound cavity size 1.5 cm in depth with a lateral 1.5 cm pocket. Initial incision was sutured  with #2 simple 4.0 ethilon sutures. 2nd incision was left open and was aggressively packed with 1/4 inch packing. Wound dressed and wound care discussed.     Assessment & Plan:  1. Infected sebaceous cyst Wound packed. Wound care discussed. Put pt on doxy to cover MRSA. Wound culture pending. She will return in 2 days for wound care.  - Wound culture - doxycycline (VIBRAMYCIN) 100 MG capsule; Take 1 capsule (100 mg total) by mouth 2 (two) times daily.  Dispense: 20 capsule; Refill: 0   Benjaman Pott. Drenda Freeze, MHS [facility]  11/14/2014

## 2014-11-12 NOTE — Patient Instructions (Addendum)
Change dressings at least daily. You may get area wet but don't scrub. Return in 2 days for wound care.

## 2014-11-14 ENCOUNTER — Ambulatory Visit (INDEPENDENT_AMBULATORY_CARE_PROVIDER_SITE_OTHER): Payer: BC Managed Care – PPO | Admitting: Physician Assistant

## 2014-11-14 VITALS — BP 127/83 | HR 96 | Temp 98.6°F | Resp 17 | Ht 69.5 in | Wt 294.0 lb

## 2014-11-14 DIAGNOSIS — L723 Sebaceous cyst: Secondary | ICD-10-CM

## 2014-11-14 DIAGNOSIS — L089 Local infection of the skin and subcutaneous tissue, unspecified: Secondary | ICD-10-CM

## 2014-11-14 NOTE — Patient Instructions (Signed)
Continue antibiotics. Continue daily dressing changes. Return in 2 days for follow up with Rosario Adie.

## 2014-11-14 NOTE — Progress Notes (Signed)
Subjective:    Patient ID: Victoria Ewing, female    DOB: 1975-12-19, 39 y.o.   MRN: [id number]  HPI  This is a 39 year old female presenting for wound care of infected sebaceous cyst on her right upper back. She underwent I&D two days ago with cyst capsule removal. She has been changing dressings daily. She is taking doxycycline 100 mg BID without problems. She states the area is still sore but much less painful than prior. She denies fever, chills, N/V/D.  Review of Systems  Constitutional: Negative for fever and chills.  Gastrointestinal: Negative for nausea, vomiting and diarrhea.  Skin: Positive for wound.    Patient Active Problem List   Diagnosis Date Noted  . Asthma, chronic 08/30/2014  . Anxiety state 07/22/2013  . Allergic rhinitis 07/22/2013   Prior to Admission medications   Medication Sig Start Date End Date Taking? Authorizing Provider  albuterol (PROVENTIL HFA;VENTOLIN HFA) 108 (90 BASE) MCG/ACT inhaler Inhale 2 puffs into the lungs every 6 (six) hours as needed for wheezing. 08/29/14  Yes Bennett Scrape V, PA-C  ALPRAZolam (XANAX) 0.5 MG tablet Take 1/2- 1 tablet by mouth twice a day as needed for anxiety. 10/25/14  Yes Barton Fanny, MD  cholecalciferol (VITAMIN D) 1000 UNITS tablet Take 1,000 Units by mouth daily.   Yes Historical Provider, MD  doxycycline (VIBRAMYCIN) 100 MG capsule Take 1 capsule (100 mg total) by mouth 2 (two) times daily. 11/12/14  Yes Bennett Scrape V, PA-C  loratadine (CLARITIN) 10 MG tablet Take 10 mg by mouth daily as needed for allergies.   Yes Historical Provider, MD   Allergies  Allergen Reactions  . Other Anaphylaxis    Pecans and nuts    Patient's social and family history were reviewed.     Objective:   Physical Exam  Constitutional: She is oriented to person, place, and time. She appears well-developed and well-nourished. No distress.  HENT:  Head: Normocephalic and atraumatic.  Right Ear: Hearing normal.  Left Ear:  Hearing normal.  Nose: Nose normal.  Eyes: Conjunctivae and lids are normal. Right eye exhibits no discharge. Left eye exhibits no discharge. No scleral icterus.  Cardiovascular: Normal rate, regular rhythm and normal pulses.   Pulmonary/Chest: Effort normal and breath sounds normal. No respiratory distress.  Musculoskeletal: Normal range of motion.  Neurological: She is alert and oriented to person, place, and time.  Skin: Skin is warm and dry.  Dressing and packing removed. #2 simple sutures in place adjacent to open incision. Wound opening is 0.75 cm. Wound cavity is 1 cm depth with 0.5 cm lateral pocket. Wound irrigated with 1% lido with epi. Small amount purulent material expressed. Wound loosely packed with 1/4 inch packing. Wound dressed.  Psychiatric: She has a normal mood and affect. Her speech is normal and behavior is normal. Thought content normal.   BP 127/83 mmHg  Pulse 96  Temp(Src) 98.6 F (37 C) (Oral)  Resp 17  Ht 5' 9.5" (1.765 m)  Wt 294 lb (133.358 kg)  BMI 42.81 kg/m2  SpO2 97%  LMP 10/24/2014     Assessment & Plan:  1. Infected sebaceous cyst Wound healing well. Small amount purulence expressed today. Wound was loosely packed. She will continue with abx and daily dressing changes. She will return in 2 days to follow up with Rosario Adie. Return in 5 days for suture removal.   Benjaman Pott. Drenda Freeze, M[facility]  11/14/2014

## 2014-11-15 LAB — WOUND CULTURE
GRAM STAIN: NONE SEEN
Gram Stain: NONE SEEN

## 2014-11-16 ENCOUNTER — Ambulatory Visit (INDEPENDENT_AMBULATORY_CARE_PROVIDER_SITE_OTHER): Payer: BC Managed Care – PPO | Admitting: Urgent Care

## 2014-11-16 VITALS — BP 130/80 | HR 76 | Temp 98.5°F | Resp 16 | Ht 69.5 in | Wt 295.0 lb

## 2014-11-16 DIAGNOSIS — Z5189 Encounter for other specified aftercare: Secondary | ICD-10-CM

## 2014-11-16 DIAGNOSIS — L089 Local infection of the skin and subcutaneous tissue, unspecified: Secondary | ICD-10-CM

## 2014-11-16 DIAGNOSIS — L723 Sebaceous cyst: Secondary | ICD-10-CM

## 2014-11-16 NOTE — Progress Notes (Signed)
    MRN: [id number] DOB: [birth year]  Subjective:   Victoria Ewing is a 39 y.o. female presenting for chief complaint of Follow-up  Patient reports that she has changed the dressing daily since her last visit. Pain is significantly improved, denies drainage, fevers, erythema. Continues to take antibiotic. Denies any other aggravating or relieving factors, no other questions or concerns.  Victoria Ewing has a current medication list which includes the following prescription(s): alprazolam, cholecalciferol, doxycycline, loratadine, and albuterol.  ROS As in subjective.  Objective:   Vitals: BP 130/80 mmHg  Pulse 76  Temp(Src) 98.5 F (36.9 C)  Resp 16  Ht 5' 9.5" (1.765 m)  Wt 295 lb (133.811 kg)  BMI 42.95 kg/m2  SpO2 99%  LMP 10/24/2014  Physical Exam  Constitutional: She is oriented to person, place, and time and well-developed, well-nourished, and in no distress.  Cardiovascular: Normal rate.   Pulmonary/Chest: Effort normal.  Neurological: She is alert and oriented to person, place, and time.  Skin:  Wound over back on left thoracic area. Dressing removed, papular skin reaction over area that made contact with adhesive from dressing. Wound expressed with small amount of serosanguinous fluid drained. 5cc of 2% lidocaine used to flush wound cavity. Wound repacked with 1/4" packing, non-adhesive dressing applied, adhesive applied in vertical orientation to avoid contact with papular skin reaction. Wound care instructions reviewed.  Psychiatric: Mood and affect normal.   Assessment and Plan :   1. Infected sebaceous cyst 2. Encounter for wound care - Stable, healing well, advised that patient removed ~1cm each day until 11/20/2014. Advised patient return for suture removal and wound care at that time or sooner if necessary.  Jaynee Eagles, PA-C [facility] [phone] 11/16/2014 6:16 PM

## 2014-11-16 NOTE — Patient Instructions (Signed)
Please pull about 1cm out and cut it every day. Change dressings at the time. We will see you back Monday for suture removal and re-evaluation of your wound.

## 2014-11-17 ENCOUNTER — Encounter: Payer: Self-pay | Admitting: Urgent Care

## 2014-11-20 ENCOUNTER — Ambulatory Visit (INDEPENDENT_AMBULATORY_CARE_PROVIDER_SITE_OTHER): Payer: BC Managed Care – PPO | Admitting: Physician Assistant

## 2014-11-20 VITALS — BP 116/76 | Temp 97.6°F

## 2014-11-20 DIAGNOSIS — L089 Local infection of the skin and subcutaneous tissue, unspecified: Secondary | ICD-10-CM

## 2014-11-20 DIAGNOSIS — L723 Sebaceous cyst: Secondary | ICD-10-CM

## 2014-11-21 ENCOUNTER — Encounter: Payer: Self-pay | Admitting: Physician Assistant

## 2014-11-21 NOTE — Progress Notes (Signed)
   Subjective:    Patient ID: Victoria Ewing, female    DOB: 1976-04-28, 39 y.o.   MRN: [id number]  HPI  This is a 39 year old female here for wound care and suture removal. She was last seen 4 days ago for wound care of an abscess on her upper right back. Wound was packed and pt was instructed to pull out 1 cm each day and pull out completely today. She reports she did pull out the packing at all but continued with dressing changes. She is not having any pain. She continues to have some drainage. She denies fever or chills. She has two days left of doxy and tolerating well.  Review of Systems  Constitutional: Negative for fever and chills.  Gastrointestinal: Negative for nausea, vomiting, abdominal pain and diarrhea.  Musculoskeletal: Negative for back pain.  Skin: Positive for wound.   Patient Active Problem List   Diagnosis Date Noted  . Asthma, chronic 08/30/2014  . Anxiety state 07/22/2013  . Allergic rhinitis 07/22/2013   Prior to Admission medications   Medication Sig Start Date End Date Taking? Authorizing Provider  albuterol (PROVENTIL HFA;VENTOLIN HFA) 108 (90 BASE) MCG/ACT inhaler Inhale 2 puffs into the lungs every 6 (six) hours as needed for wheezing. 08/29/14  Yes Bennett Scrape V, PA-C  ALPRAZolam (XANAX) 0.5 MG tablet Take 1/2- 1 tablet by mouth twice a day as needed for anxiety. 10/25/14  Yes Barton Fanny, MD  cholecalciferol (VITAMIN D) 1000 UNITS tablet Take 1,000 Units by mouth daily.   Yes Historical Provider, MD  doxycycline (VIBRAMYCIN) 100 MG capsule Take 1 capsule (100 mg total) by mouth 2 (two) times daily. 11/12/14  Yes Bennett Scrape V, PA-C  loratadine (CLARITIN) 10 MG tablet Take 10 mg by mouth daily as needed for allergies.   Yes Historical Provider, MD   Allergies  Allergen Reactions  . Other Anaphylaxis    Pecans and nuts    Patient's social and family history were reviewed.     Objective:   Physical Exam  Constitutional: She is oriented to  person, place, and time. She appears well-developed and well-nourished. No distress.  HENT:  Head: Normocephalic and atraumatic.  Right Ear: Hearing normal.  Left Ear: Hearing normal.  Nose: Nose normal.  Eyes: Conjunctivae and lids are normal. Right eye exhibits no discharge. Left eye exhibits no discharge. No scleral icterus.  Pulmonary/Chest: Effort normal. No respiratory distress.  Musculoskeletal: Normal range of motion.  Neurological: She is alert and oriented to person, place, and time.  Skin: Skin is warm and dry.  Dressing and packing removed. #2 sutures removed. No additional purulence. Wound irrigated with 5 cc 1% lido. Wound dressed.  Psychiatric: She has a normal mood and affect. Her speech is normal and behavior is normal. Thought content normal.   BP 116/76 mmHg  Temp(Src) 97.6 F (36.4 C)  LMP 11/15/2014     Assessment & Plan:  1. Infected sebaceous cyst No additional purulence. Pulled out packing and sutures and will leave to heal. She will continue daily dressing changes until no longer draining. Continue doxy until finished. She will return with further concerns.   Benjaman Pott Drenda Freeze, M[facility]  11/21/2014

## 2014-12-14 ENCOUNTER — Ambulatory Visit (INDEPENDENT_AMBULATORY_CARE_PROVIDER_SITE_OTHER): Payer: BC Managed Care – PPO | Admitting: Family Medicine

## 2014-12-14 ENCOUNTER — Encounter: Payer: Self-pay | Admitting: Family Medicine

## 2014-12-14 VITALS — BP 132/79 | HR 75 | Temp 98.4°F | Resp 16 | Ht 69.75 in | Wt 290.6 lb

## 2014-12-14 DIAGNOSIS — Z0000 Encounter for general adult medical examination without abnormal findings: Secondary | ICD-10-CM | POA: Diagnosis not present

## 2014-12-14 DIAGNOSIS — Z113 Encounter for screening for infections with a predominantly sexual mode of transmission: Secondary | ICD-10-CM | POA: Diagnosis not present

## 2014-12-14 DIAGNOSIS — L309 Dermatitis, unspecified: Secondary | ICD-10-CM | POA: Diagnosis not present

## 2014-12-14 DIAGNOSIS — Z124 Encounter for screening for malignant neoplasm of cervix: Secondary | ICD-10-CM | POA: Diagnosis not present

## 2014-12-14 DIAGNOSIS — Z01419 Encounter for gynecological examination (general) (routine) without abnormal findings: Secondary | ICD-10-CM

## 2014-12-14 LAB — POCT URINALYSIS DIPSTICK
BILIRUBIN UA: NEGATIVE
Glucose, UA: NEGATIVE
KETONES UA: NEGATIVE
Leukocytes, UA: NEGATIVE
Nitrite, UA: NEGATIVE
PH UA: 5
PROTEIN UA: NEGATIVE
Spec Grav, UA: >=1.03
Urobilinogen, UA: 0.2

## 2014-12-14 LAB — HIV ANTIBODY (ROUTINE TESTING W REFLEX): HIV: NONREACTIVE

## 2014-12-14 LAB — RPR

## 2014-12-14 MED ORDER — TRIAMCINOLONE ACETONIDE 0.1 % EX OINT: 1.0000 "application " | TOPICAL_OINTMENT | Freq: Two times a day (BID) | CUTANEOUS | Status: DC

## 2014-12-14 MED ORDER — ALPRAZOLAM 0.5 MG PO TABS: ORAL_TABLET | ORAL | Status: DC

## 2014-12-14 NOTE — Patient Instructions (Addendum)
Keeping You Healthy  Get These Tests  Blood Pressure- Have your blood pressure checked once a year by your health care provider.  Normal blood pressure is 120/80.  Weight- Have your body mass index (BMI) calculated to screen for obesity.  BMI is measure of body fat based on height and weight.  You can also calculate your own BMI at GravelBags.it.  Cholesterol- Have your cholesterol checked every 5 years starting at age 37 then yearly starting at age 46.  Chlamydia, HIV, and other sexually transmitted diseases- Get screened every year until age 46, then within three months of each new sexual provider.  Pap Smear- Every 1-3 years; discuss with your health care provider.  Mammogram- Every year starting at age 9  Take these medicines  Calcium with Vitamin D-Your body needs 1200 mg of Calcium each day and [phone] IU of Vitamin D daily.  Your body can only absorb 500 mg of Calcium at a time so Calcium must be taken in 2 or 3 divided doses throughout the day.  Multivitamin with folic acid- Once daily if it is possible for you to become pregnant.  Get these Immunizations  Gardasil-Series of three doses; prevents HPV related illness such as genital warts and cervical cancer.  Menactra-Single dose; prevents meningitis.  Tetanus shot- Every 10 years.  Flu shot-Every year.  Take these steps 1. Do not smoke-Your healthcare provider can help you quit.  For tips on how to quit go to www.smokefree.gov or call 1-800 QUITNOW. 2. Be physically active- Exercise 5 days a week for at least 30 minutes.  If you are not already physically active, start slow and gradually work up to 30 minutes of moderate physical activity.  Examples of moderate activity include walking briskly, dancing, swimming, bicycling, etc. 3. Breast Cancer- A self breast exam every month is important for early detection of breast cancer.  For more information and instruction on self breast exams, ask your healthcare  provider or https://www.patel.info/. 4. Eat a healthy diet- Eat a variety of healthy foods such as fruits, vegetables, whole grains, low fat milk, low fat cheeses, yogurt, lean meats, poultry and fish, beans, nuts, tofu, etc.  For more information go to www. Thenutritionsource.org 5. Drink alcohol in moderation- Limit alcohol intake to one drink or less per day. Never drink and drive. 6. Depression- Your emotional health is as important as your physical health.  If you're feeling down or losing interest in things you normally enjoy please talk to your healthcare provider about being screened for depression. 7. Dental visit- Brush and floss your teeth twice daily; visit your dentist twice a year. 8. Eye doctor- Get an eye exam at least every 2 years. 9. Helmet use- Always wear a helmet when riding a bicycle, motorcycle, rollerblading or skateboarding. 33. Safe sex- If you may be exposed to sexually transmitted infections, use a condom. 11. Seat belts- Seat belts can save your live; always wear one. 12. Smoke/Carbon Monoxide detectors- These detectors need to be installed on the appropriate level of your home. Replace batteries at least once a year. 13. Skin cancer- When out in the sun please cover up and use sunscreen 15 SPF or higher. 14. Violence- If anyone is threatening or hurting you, please tell your healthcare provider.   Eczema Eczema, also called atopic dermatitis, is a skin disorder that causes inflammation of the skin. It causes a red rash and dry, scaly skin. The skin becomes very itchy. Eczema is generally worse during the cooler winter  months and often improves with the warmth of summer. Eczema usually starts showing signs in infancy. Some children outgrow eczema, but it may last through adulthood.  CAUSES  The exact cause of eczema is not known, but it appears to run in families. People with eczema often have a family history of eczema, allergies, asthma, or hay  fever. Eczema is not contagious. Flare-ups of the condition may be caused by:   Contact with something you are sensitive or allergic to.   Stress. SIGNS AND SYMPTOMS  Dry, scaly skin.   Red, itchy rash.   Itchiness. This may occur before the skin rash and may be very intense.  DIAGNOSIS  The diagnosis of eczema is usually made based on symptoms and medical history. TREATMENT  Eczema cannot be cured, but symptoms usually can be controlled with treatment and other strategies. A treatment plan might include:  Controlling the itching and scratching.   Use over-the-counter antihistamines as directed for itching. This is especially useful at night when the itching tends to be worse.   Use over-the-counter steroid creams as directed for itching.   Avoid scratching. Scratching makes the rash and itching worse. It may also result in a skin infection (impetigo) due to a break in the skin caused by scratching.   Keeping the skin well moisturized with creams every day. This will seal in moisture and help prevent dryness. Lotions that contain alcohol and water should be avoided because they can dry the skin.   Limiting exposure to things that you are sensitive or allergic to (allergens).   Recognizing situations that cause stress.   Developing a plan to manage stress.  HOME CARE INSTRUCTIONS   Only take over-the-counter or prescription medicines as directed by your health care provider.   Do not use anything on the skin without checking with your health care provider.   Keep baths or showers short (5 minutes) in warm (not hot) water. Use mild cleansers for bathing. These should be unscented. You may add nonperfumed bath oil to the bath water. It is best to avoid soap and bubble bath.   Immediately after a bath or shower, when the skin is still damp, apply a moisturizing ointment to the entire body. This ointment should be a petroleum ointment. This will seal in moisture and  help prevent dryness. The thicker the ointment, the better. These should be unscented.   Keep fingernails cut short. Children with eczema may need to wear soft gloves or mittens at night after applying an ointment.   Dress in clothes made of cotton or cotton blends. Dress lightly, because heat increases itching.   A child with eczema should stay away from anyone with fever blisters or cold sores. The virus that causes fever blisters (herpes simplex) can cause a serious skin infection in children with eczema. SEEK MEDICAL CARE IF:   Your itching interferes with sleep.   Your rash gets worse or is not better within 1 week after starting treatment.   You see pus or soft yellow scabs in the rash area.   You have a fever.   You have a rash flare-up after contact with someone who has fever blisters.  Document Released: 09/19/2000 Document Revised: 07/13/2013 Document Reviewed: 04/25/2013 [facility] Patient Information 2015 [place], [place]. This information is not intended to replace advice given to you by your health care provider. Make sure you discuss any questions you have with your health care provider.         Mediterranean Diet  Why follow it? Research shows. . Those who follow the Mediterranean diet have a reduced risk of heart disease  . The diet is associated with a reduced incidence of Parkinson's and Alzheimer's diseases . People following the diet may have longer life expectancies and lower rates of chronic diseases  . The Dietary Guidelines for Americans recommends the Mediterranean diet as an eating plan to promote health and prevent disease  What Is the Mediterranean Diet?  . Healthy eating plan based on typical foods and recipes of Mediterranean-style cooking . The diet is primarily a plant based diet; these foods should make up a majority of meals   Starches - Plant based foods should make up a majority of meals - They are an important sources of vitamins,  minerals, energy, antioxidants, and fiber - Choose whole grains, foods high in fiber and minimally processed items  - Typical grain sources include wheat, oats, barley, corn, brown rice, bulgar, farro, millet, polenta, couscous  - Various types of beans include chickpeas, lentils, fava beans, black beans, white beans   Fruits  Veggies - Large quantities of antioxidant rich fruits & veggies; 6 or more servings  - Vegetables can be eaten raw or lightly drizzled with oil and cooked  - Vegetables common to the traditional Mediterranean Diet include: artichokes, arugula, beets, broccoli, brussel sprouts, cabbage, carrots, celery, collard greens, cucumbers, eggplant, kale, leeks, lemons, lettuce, mushrooms, okra, onions, peas, peppers, potatoes, pumpkin, radishes, rutabaga, shallots, spinach, sweet potatoes, turnips, zucchini - Fruits common to the Mediterranean Diet include: apples, apricots, avocados, cherries, clementines, dates, figs, grapefruits, grapes, melons, nectarines, oranges, peaches, pears, pomegranates, strawberries, tangerines  Fats - Replace butter and margarine with healthy oils, such as olive oil, canola oil, and tahini  - Limit nuts to no more than a handful a day  - Nuts include walnuts, almonds, pecans, pistachios, pine nuts  - Limit or avoid candied, honey roasted or heavily salted nuts - Olives are central to the [employer] - can be eaten whole or used in a variety of dishes   Meats Protein - Limiting red meat: no more than a few times a month - When eating red meat: choose lean cuts and keep the portion to the size of deck of cards - Eggs: approx. 0 to 4 times a week  - Fish and lean poultry: at least 2 a week  - Healthy protein sources include, chicken, [place], lean beef, lamb - Increase intake of seafood such as tuna, salmon, trout, mackerel, shrimp, scallops - Avoid or limit high fat processed meats such as sausage and bacon  Dairy - Include moderate amounts of low  fat dairy products  - Focus on healthy dairy such as fat free yogurt, skim milk, low or reduced fat cheese - Limit dairy products higher in fat such as whole or 2% milk, cheese, ice cream  Alcohol - Moderate amounts of red wine is ok  - No more than 5 oz daily for women (all ages) and men older than age 61  - No more than 10 oz of wine daily for men younger than 3  Other - Limit sweets and other desserts  - Use herbs and spices instead of salt to flavor foods  - Herbs and spices common to the traditional Mediterranean Diet include: basil, bay leaves, chives, cloves, cumin, fennel, garlic, lavender, marjoram, mint, oregano, parsley, pepper, rosemary, sage, savory, sumac, tarragon, thyme   It's not just a diet, it's a lifestyle:  . The Mediterranean diet  includes lifestyle factors typical of those in the region  . Foods, drinks and meals are best eaten with others and savored . Daily physical activity is important for overall good health . This could be strenuous exercise like running and aerobics . This could also be more leisurely activities such as walking, housework, yard-work, or taking the stairs . Moderation is the key; a balanced and healthy diet accommodates most foods and drinks . Consider portion sizes and frequency of consumption of certain foods   Meal Ideas & Options:  . Breakfast:  o Whole wheat toast or whole wheat English muffins with peanut butter & hard boiled egg o Steel cut oats topped with apples & cinnamon and skim milk  o Fresh fruit: banana, strawberries, melon, berries, peaches  o Smoothies: strawberries, bananas, greek yogurt, peanut butter o Low fat greek yogurt with blueberries and granola  o Egg white omelet with spinach and mushrooms o Breakfast couscous: whole wheat couscous, apricots, skim milk, cranberries  . Sandwiches:  o Hummus and grilled vegetables (peppers, zucchini, squash) on whole wheat bread   o Grilled chicken on whole wheat pita with lettuce,  tomatoes, cucumbers or tzatziki  o Tuna salad on whole wheat bread: tuna salad made with greek yogurt, olives, red peppers, capers, green onions o Garlic rosemary lamb pita: lamb sauted with garlic, rosemary, salt & pepper; add lettuce, cucumber, greek yogurt to pita - flavor with lemon juice and black pepper  . Seafood:  o Mediterranean grilled salmon, seasoned with garlic, basil, parsley, lemon juice and black pepper o Shrimp, lemon, and spinach whole-grain pasta salad made with low fat greek yogurt  o Seared scallops with lemon orzo  o Seared tuna steaks seasoned salt, pepper, coriander topped with tomato mixture of olives, tomatoes, olive oil, minced garlic, parsley, green onions and cappers  . Meats:  o Herbed greek chicken salad with kalamata olives, cucumber, feta  o Red bell peppers stuffed with spinach, bulgur, lean ground beef (or lentils) & topped with feta   o Kebabs: skewers of chicken, tomatoes, onions, zucchini, squash  o [place] burgers: made with red onions, mint, dill, lemon juice, feta cheese topped with roasted red peppers . Vegetarian o Cucumber salad: cucumbers, artichoke hearts, celery, red onion, feta cheese, tossed in olive oil & lemon juice  o Hummus and whole grain pita points with a greek salad (lettuce, tomato, feta, olives, cucumbers, red onion) o Lentil soup with celery, carrots made with vegetable broth, garlic, salt and pepper  o Tabouli salad: parsley, bulgur, mint, scallions, cucumbers, tomato, radishes, lemon juice, olive oil, salt and pepper. o      Exercise to Lose Weight Exercise and a healthy diet may help you lose weight. Your doctor may suggest specific exercises. EXERCISE IDEAS AND TIPS  Choose low-cost things you enjoy doing, such as walking, bicycling, or exercising to workout videos.  Take stairs instead of the elevator.  Walk during your lunch break.  Park your car further away from work or school.  Go to a gym or an exercise  class.  Start with 5 to 10 minutes of exercise each day. Build up to 30 minutes of exercise 4 to 6 days a week.  Wear shoes with good support and comfortable clothes.  Stretch before and after working out.  Work out until you breathe harder and your heart beats faster.  Drink extra water when you exercise.  Do not do so much that you hurt yourself, feel dizzy, or get very short  of breath. Exercises that burn about 150 calories:  Running 1  miles in 15 minutes.  Playing volleyball for 45 to 60 minutes.  Washing and waxing a car for 45 to 60 minutes.  Playing touch football for 45 minutes.  Walking 1  miles in 35 minutes.  Pushing a stroller 1  miles in 30 minutes.  Playing basketball for 30 minutes.  Raking leaves for 30 minutes.  Bicycling 5 miles in 30 minutes.  Walking 2 miles in 30 minutes.  Dancing for 30 minutes.  Shoveling snow for 15 minutes.  Swimming laps for 20 minutes.  Walking up stairs for 15 minutes.  Bicycling 4 miles in 15 minutes.  Gardening for 30 to 45 minutes.  Jumping rope for 15 minutes.  Washing windows or floors for 45 to 60 minutes. Document Released: 10/25/2010 Document Revised: 12/15/2011 Document Reviewed: 10/25/2010 [facility] Patient Information 2015 [place], [place]. This information is not intended to replace advice given to you by your health care provider. Make sure you discuss any questions you have with your health care provider.

## 2014-12-14 NOTE — Progress Notes (Signed)
Subjective:    Patient ID: Victoria Ewing, female    DOB: 1975-11-04, 39 y.o.   MRN: [id number]  HPI  This 39 y.o. Female is here for CPE/PAP. Chronic anxiety is stable on current prn Alprazolam. 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 Pt making lifestyle changes to reduce stress, cope with anxiety, reduce weight and improve sleep hygiene. She is single parent to toddler who will soon be 87 years old.  HCM: PAP- ~2- 3 years ago (negative).           MMG- Had baseline several years ago (negative).           IMM- Current.             Patient Active Problem List   Diagnosis Date Noted  . Eczema 12/14/2014  . Asthma, chronic 08/30/2014  . Anxiety state 07/22/2013  . Allergic rhinitis 07/22/2013    Prior to Admission medications   Medication Sig Start Date End Date Taking? Authorizing Provider  albuterol (PROVENTIL HFA;VENTOLIN HFA) 108 (90 BASE) MCG/ACT inhaler Inhale 2 puffs into the lungs every 6 (six) hours as needed for wheezing. 08/29/14  Yes Bennett Scrape V, PA-C  ALPRAZolam (XANAX) 0.5 MG tablet Take 1/2- 1 tablet by mouth twice a day as needed for anxiety. 10/25/14  Yes Barton Fanny, MD  cholecalciferol (VITAMIN D) 1000 UNITS tablet Take 2,000 Units by mouth daily.    Yes Historical Provider, MD  fexofenadine (ALLEGRA) 180 MG tablet Take 180 mg by mouth daily.   Yes Historical Provider, MD    Past Surgical History  Procedure Laterality Date  . Cesarean section      History   Social History  . Marital Status: Unknown    Spouse Name: N/A  . Number of Children: N/A  . Years of Education: N/A   Occupational History  . Not on file.   Social History Main Topics  . Smoking status: Never Smoker   . Smokeless tobacco: Never Used  . Alcohol Use: No  . Drug Use: No  . Sexual Activity: Not Currently   Other Topics Concern  . Not on file   Social History Narrative    Family History  Problem Relation Age of Onset  .  Cancer Mother     Review of Systems  HENT: Positive for sneezing.   Eyes: Positive for visual disturbance.  Musculoskeletal: Positive for myalgias.  Skin: Positive for rash.  Allergic/Immunologic: Positive for environmental allergies.  Neurological: Positive for dizziness, light-headedness and headaches.  Psychiatric/Behavioral: Positive for sleep disturbance. The patient is nervous/anxious.        Objective:   Physical Exam  Constitutional: She is oriented to person, place, and time. Vital signs are normal. She appears well-developed and well-nourished. No distress.  Blood pressure 132/79, pulse 75, temperature 98.4 F (36.9 C), temperature source Oral, resp. rate 16, height 5' 9.75" (1.772 m), weight 290 lb 9.6 oz (131.815 kg), last menstrual period 11/23/2014, SpO2 97 %.   HENT:  Head: Normocephalic and atraumatic.  Right Ear: Hearing, tympanic membrane, external ear and ear canal normal.  Left Ear: Hearing, tympanic membrane, external ear and ear canal normal.  Nose: Nose normal. No nasal deformity or septal deviation.  Mouth/Throat: Uvula is midline, oropharynx is clear and moist and mucous membranes are normal. No oral lesions. Normal dentition. No dental caries.  Eyes: Conjunctivae, EOM and lids are normal. Pupils are equal, round, and reactive to light. No scleral  icterus.  Neck: Trachea normal, normal range of motion, full passive range of motion without pain and phonation normal. Neck supple. No spinous process tenderness and no muscular tenderness present. No thyroid mass and no thyromegaly present.  Cardiovascular: Normal rate, regular rhythm, S1 normal, S2 normal, normal heart sounds and normal pulses.   No extrasystoles are present. PMI is not displaced.  Exam reveals no gallop and no friction rub.   No murmur heard. Pulmonary/Chest: Effort normal and breath sounds normal. No respiratory distress. She has no decreased breath sounds. She has no wheezes. She has no rhonchi.  Right breast exhibits no inverted nipple, no mass, no nipple discharge, no skin change and no tenderness. Left breast exhibits no inverted nipple, no mass, no nipple discharge, no skin change and no tenderness. Breasts are symmetrical.  Abdominal: Soft. Normal appearance and bowel sounds are normal. She exhibits no distension and no mass. There is no hepatosplenomegaly. There is no tenderness. There is no guarding and no CVA tenderness.  Genitourinary: Rectum normal, vagina normal and uterus normal. There is no rash, tenderness or lesion on the right labia. There is no rash, tenderness or lesion on the left labia. Cervix exhibits no motion tenderness, no discharge and no friability. Right adnexum displays no mass, no tenderness and no fullness. Left adnexum displays no mass, no tenderness and no fullness.  Cervix appears nulliparous.  Musculoskeletal:       Cervical back: Normal.       Thoracic back: Normal.       Lumbar back: Normal.  Remainder of exam unremarkable.  Lymphadenopathy:       Head (right side): No submental, no submandibular, no tonsillar, no preauricular, no posterior auricular and no occipital adenopathy present.       Head (left side): No submental, no submandibular, no tonsillar, no preauricular, no posterior auricular and no occipital adenopathy present.    She has no cervical adenopathy.    She has no axillary adenopathy.       Right: No inguinal and no supraclavicular adenopathy present.       Left: No inguinal and no supraclavicular adenopathy present.  Neurological: She is alert and oriented to person, place, and time. She has normal strength and normal reflexes. She displays no atrophy. No cranial nerve deficit or sensory deficit. She exhibits normal muscle tone. She displays a negative Romberg sign. Coordination and gait normal.  Skin: Skin is warm, dry and intact. Rash noted. No ecchymosis and no lesion noted. She is not diaphoretic. No cyanosis or erythema. Nails show no  clubbing.  Small patches of dry, scaly skin c/w eczema.  Psychiatric: She has a normal mood and affect. Her speech is normal and behavior is normal. Judgment and thought content normal. Cognition and memory are normal.  Nursing note and vitals reviewed.      Assessment & Plan:  Encounter for health maintenance examination- Encouraged commitment to healthier lifestyle and weight reduction.  Encounter for cervical Pap smear with pelvic exam - Plan: Pap IG, CT/NG w/ reflex HPV when ASC-U  Screening for STD (sexually transmitted disease) - Plan: HIV antibody, RPR, POCT urinalysis dipstick  Eczema  Meds ordered this encounter  Medications  . fexofenadine (ALLEGRA) 180 MG tablet    Sig: Take 180 mg by mouth daily.  Marland Kitchen ALPRAZolam (XANAX) 0.5 MG tablet    Sig: Take 1/2- 1 tablet by mouth twice a day as needed for anxiety.    Dispense:  30 tablet    Refill:  2    Not to exceed 5 additional fills before 03/17/2015  . triamcinolone ointment (KENALOG) 0.1 %    Sig: Apply 1 application topically 2 (two) times daily.    Dispense:  30 g    Refill:  3

## 2014-12-15 LAB — PAP IG, CT-NG, RFX HPV ASCU
CHLAMYDIA PROBE AMP: NEGATIVE
GC PROBE AMP: NEGATIVE

## 2014-12-16 ENCOUNTER — Other Ambulatory Visit: Payer: Self-pay | Admitting: Family Medicine

## 2014-12-16 MED ORDER — METRONIDAZOLE 500 MG PO TABS: 500.0000 mg | ORAL_TABLET | Freq: Two times a day (BID) | ORAL | Status: DC

## 2014-12-16 NOTE — Progress Notes (Signed)
Quick Note:  Please advise pt regarding following labs... PAP is negative and all STD results are negative/nonreactive. There were some findings on the PAP suggestive of Bacterial vaginosis; I will prescribe medication to treat this vaginal discharge.  Copy of results to pt. ______

## 2015-04-02 ENCOUNTER — Other Ambulatory Visit: Payer: Self-pay | Admitting: Family Medicine

## 2015-04-03 NOTE — Telephone Encounter (Signed)
I will refill medication but patient needs to plan to come in for counseling on anxiety and maintenance therapy. Xanax is not meant to be used daily/long term for anxiety. Please let patient know about this.

## 2015-04-06 ENCOUNTER — Other Ambulatory Visit: Payer: Self-pay | Admitting: Family Medicine

## 2015-04-06 NOTE — Telephone Encounter (Signed)
Xanax called into pharm

## 2015-04-06 NOTE — Telephone Encounter (Signed)
Called in Rx

## 2015-07-10 ENCOUNTER — Ambulatory Visit (INDEPENDENT_AMBULATORY_CARE_PROVIDER_SITE_OTHER): Payer: BC Managed Care – PPO | Admitting: Physician Assistant

## 2015-07-10 ENCOUNTER — Encounter: Payer: Self-pay | Admitting: Physician Assistant

## 2015-07-10 VITALS — BP 130/80 | HR 71 | Temp 98.5°F | Resp 16 | Ht 69.5 in | Wt 302.6 lb

## 2015-07-10 DIAGNOSIS — R221 Localized swelling, mass and lump, neck: Secondary | ICD-10-CM | POA: Diagnosis not present

## 2015-07-10 DIAGNOSIS — J4520 Mild intermittent asthma, uncomplicated: Secondary | ICD-10-CM | POA: Diagnosis not present

## 2015-07-10 DIAGNOSIS — F411 Generalized anxiety disorder: Secondary | ICD-10-CM

## 2015-07-10 MED ORDER — ALBUTEROL SULFATE HFA 108 (90 BASE) MCG/ACT IN AERS: 2.0000 | INHALATION_SPRAY | Freq: Four times a day (QID) | RESPIRATORY_TRACT | Status: DC | PRN

## 2015-07-10 MED ORDER — ALPRAZOLAM 0.25 MG PO TABS: 0.2500 mg | ORAL_TABLET | Freq: Every day | ORAL | Status: DC
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
## 2015-07-10 NOTE — Patient Instructions (Addendum)
You will get a phone call to schedule appt for ultrasound. Let me know if you need me to fill a different inhaler. Take 1 tab a day for 1 week, then 1/2 a tab for 1 week. Then you can either stop or continue or take every other day for another week until you stop. Return around February for a complete physical.

## 2015-07-24 ENCOUNTER — Ambulatory Visit
Admission: RE | Admit: 2015-07-24 | Discharge: 2015-07-24 | Disposition: A | Discharge status: Home / Self Care | Payer: BC Managed Care – PPO | Source: Ambulatory Visit | Attending: Physician Assistant | Admitting: Physician Assistant

## 2015-07-24 DIAGNOSIS — R221 Localized swelling, mass and lump, neck: Secondary | ICD-10-CM

## 2015-08-21 ENCOUNTER — Telehealth: Payer: Self-pay

## 2015-08-21 NOTE — Telephone Encounter (Signed)
[phone]  Victoria Ewing  Patient wants to talk to you regarding weaning off of the xanax.  Please call number above.

## 2015-08-22 NOTE — Telephone Encounter (Signed)
Spoke with pt. She is not doing well with weaning off xanax. She states she is feeling more anxious taking 0.25 mg (she was taken 0.5 mg QAM before). She took 0.375 mg today and felt ok. She is still wary about starting an anti-depressant since she had a reaction to celexa 2 years ago -- which I'm not convinced was a true reaction. She is going to research zoloft and prozac and let me know what she decides tomorrow. She will stay at 0.375 mg xanax QAM for the time being.

## 2015-09-04 ENCOUNTER — Telehealth: Payer: Self-pay

## 2015-09-04 DIAGNOSIS — F419 Anxiety disorder, unspecified: Secondary | ICD-10-CM

## 2015-09-04 DIAGNOSIS — F411 Generalized anxiety disorder: Secondary | ICD-10-CM

## 2015-09-04 NOTE — Telephone Encounter (Signed)
Pt requesting call back from Bennett Scrape re medication changes   Best phone for pt is [phone]

## 2015-09-05 ENCOUNTER — Other Ambulatory Visit: Payer: Self-pay | Admitting: Physician Assistant

## 2015-09-05 MED ORDER — SERTRALINE HCL 50 MG PO TABS: 50.0000 mg | ORAL_TABLET | Freq: Every day | ORAL | Status: DC

## 2015-09-05 MED ORDER — ALPRAZOLAM 0.25 MG PO TABS: 0.2500 mg | ORAL_TABLET | Freq: Every day | ORAL | Status: DC

## 2015-09-05 NOTE — Telephone Encounter (Signed)
Pt wanting to start on zoloft. Her mom and sister have both been on zoloft before and worked well for them. She will start 1/2 tab x 1 week then increase to 1 tab qd thereafter.  She will transition to using xanax 0.25 mg only as needed. Xanax refilled. She will return for f/u 10/23/15

## 2015-09-05 NOTE — Telephone Encounter (Signed)
Faxed alprazolam Rx. 

## 2015-09-07 ENCOUNTER — Telehealth: Payer: Self-pay

## 2015-09-07 DIAGNOSIS — F411 Generalized anxiety disorder: Secondary | ICD-10-CM

## 2015-09-07 MED ORDER — ALPRAZOLAM 0.25 MG PO TABS: ORAL_TABLET | ORAL | Status: DC

## 2015-09-07 NOTE — Telephone Encounter (Signed)
Meds ordered this encounter  Medications  . ALPRAZolam (XANAX) 0.25 MG tablet    Sig: Take 1 to 1.5 tablets by mouth daily as needed.    Dispense:  45 tablet    Refill:  0

## 2015-09-07 NOTE — Telephone Encounter (Signed)
Pt called and reported that she has not been able to get her xanax filled that was written by Elmyra Ricks (see 11/29 message) because the previous one written 10/4 was only for 1 tablet QD #30. On 11/15 Elmyra Ricks wrote the following copied from message:    Expand All Collapse All   Spoke with pt. She is not doing well with weaning off xanax. She states she is feeling more anxious taking 0.25 mg (she was taken 0.5 mg QAM before). She took 0.375 mg today and felt ok. She is still wary about starting an anti-depressant since she had a reaction to celexa 2 years ago -- which I'm not convinced was a true reaction. She is going to research zoloft and prozac and let me know what she decides tomorrow. She will stay at 0.375 mg xanax QAM for the time being.       Because pt was taking 1 1/2 tablets some of the time, her current Rx can not be filled until 11/4. Pt is experiencing what she believes are withdrawal Sxs from trying to make her xanax last : she took none on Wed, and 1/2 tablet yesterday. Pt states that she is feeling imbalance, does not feel steady. Also, "feels better with my eyes closed. When I try to look at someone and talk, I feel like a twitch. It is an uncomfortable feeling". She has one xanax left which she will take now to see if her Sxs improve and requests that a new Rx be written w/sig take 1 - 1.5 tabs QD as needed, so that Rx can be filled now and she can take more if needed until the Zoloft starts to work.

## 2015-09-07 NOTE — Telephone Encounter (Signed)
Faxed Rx and notified pt. 

## 2015-10-23 ENCOUNTER — Encounter: Payer: Self-pay | Admitting: Physician Assistant

## 2015-10-23 ENCOUNTER — Ambulatory Visit (INDEPENDENT_AMBULATORY_CARE_PROVIDER_SITE_OTHER): Payer: BC Managed Care – PPO | Admitting: Physician Assistant

## 2015-10-23 VITALS — BP 127/84 | HR 76 | Temp 98.3°F | Resp 16 | Ht 69.5 in | Wt 294.0 lb

## 2015-10-23 DIAGNOSIS — F411 Generalized anxiety disorder: Secondary | ICD-10-CM

## 2015-10-23 DIAGNOSIS — R1030 Lower abdominal pain, unspecified: Secondary | ICD-10-CM

## 2015-10-23 DIAGNOSIS — R319 Hematuria, unspecified: Secondary | ICD-10-CM | POA: Diagnosis not present

## 2015-10-23 LAB — POCT URINALYSIS DIP (MANUAL ENTRY)
BILIRUBIN UA: NEGATIVE
Bilirubin, UA: NEGATIVE
Glucose, UA: NEGATIVE
LEUKOCYTES UA: NEGATIVE
Nitrite, UA: NEGATIVE
PH UA: 5
Protein Ur, POC: NEGATIVE
Urobilinogen, UA: 0.2

## 2015-10-23 LAB — POC MICROSCOPIC URINALYSIS (UMFC): Mucus: ABSENT

## 2015-10-23 MED ORDER — BUSPIRONE HCL 10 MG PO TABS: 10.0000 mg | ORAL_TABLET | Freq: Two times a day (BID) | ORAL | Status: DC

## 2015-10-23 MED ORDER — ALPRAZOLAM 0.25 MG PO TABS: ORAL_TABLET | ORAL | Status: DC

## 2015-10-23 NOTE — Progress Notes (Signed)
[facility] [address] [place] [address] [phone]  Date:  10/23/2015   Name:  Victoria Ewing   DOB:  1975/12/01   MRN:  [id number]  PCP:  No PCP Per Patient    Chief Complaint: Follow-up and Urinary Frequency   History of Present Illness:  This is a 40 y.o. female with PMH asthma, anxiety, allergic rhinitis who is presenting for follow up anxiety. Pt used to take xanax 0.5 mg QD for anxiety. I saw her 07/2015 - she was wanting to discuss weaning off xanax. She had learned that xanax was not meant to be taken daily like that. We started to wean her off the xanax but pt was finding that she was becoming much more anxious. She is currently taking 0.25-0.75 mg QD prn. She states she can sometimes go 2-3 days without taking which is a victory for her. Pt was very wary of starting on an SSRI. A couple years ago she was started on celexa and had a bad reaction - she felt it was making her anxiety worse. 09/04/15 pt called and had decided she wanted to start on zoloft. Took for 2 weeks but caused nausea and constant headache. She stopped the medication. She states she has good and bad days with her anxiety. She really wants to start therapy but doesn't have finances in order yet. She states her goal for 2017 is to start driving again and knows she needs therapy to help with this.  Pt also complaining of lower abdominal heaviness for the past 2 days. Makes her feel like she needs to urinate but usually doesn't have to. She denies dysuria or hematuria. She is not sexually active currently. LMP 2 weeks ago. No vaginal bleeding currently. She denies fever, chills, back pain, n/v. She admits that she frequently holds in her urine, esp in mornings.  Review of Systems:  Review of Systems See HPI  Patient Active Problem List   Diagnosis Date Noted  . Eczema 12/14/2014  . Asthma, chronic 08/30/2014  . Anxiety state 07/22/2013  . Allergic rhinitis 07/22/2013    Prior  to Admission medications   Medication Sig Start Date End Date Taking? Authorizing Provider  albuterol (PROVENTIL HFA;VENTOLIN HFA) 108 (90 BASE) MCG/ACT inhaler Inhale 2 puffs into the lungs every 6 (six) hours as needed for wheezing. 07/10/15  Yes Bennett Scrape V, PA-C  ALPRAZolam (XANAX) 0.25 MG tablet Take 1 to 1.5 tablets by mouth daily as needed. 09/07/15  Yes Chelle Jeffery, PA-C  cholecalciferol (VITAMIN D) 1000 UNITS tablet Take 2,000 Units by mouth daily.    Yes Historical Provider, MD  loratadine (CLARITIN) 10 MG tablet Take 10 mg by mouth daily.   Yes Historical Provider, MD  triamcinolone ointment (KENALOG) 0.1 % Apply 1 application topically 2 (two) times daily. 12/14/14  Yes Barton Fanny, MD           Allergies  Allergen Reactions  . Other Anaphylaxis    Pecans and nuts     Past Surgical History  Procedure Laterality Date  . Cesarean section      Social History  Substance Use Topics  . Smoking status: Never Smoker   . Smokeless tobacco: Never Used  . Alcohol Use: No    Family History  Problem Relation Age of Onset  . Cancer Mother     Medication list has been reviewed and updated.  Physical Examination:  Physical Exam  Constitutional: She is oriented to person,  place, and time. She appears well-developed and well-nourished. No distress.  HENT:  Head: Normocephalic and atraumatic.  Right Ear: Hearing, tympanic membrane, external ear and ear canal normal.  Left Ear: Hearing, tympanic membrane, external ear and ear canal normal.  Nose: Nose normal.  Mouth/Throat: Uvula is midline, oropharynx is clear and moist and mucous membranes are normal.  Eyes: Conjunctivae and lids are normal. Right eye exhibits no discharge. Left eye exhibits no discharge. No scleral icterus.  Cardiovascular: Normal rate, regular rhythm, normal heart sounds and normal pulses.   No murmur heard. Pulmonary/Chest: Effort normal and breath sounds normal. No respiratory distress. She has  no wheezes. She has no rhonchi. She has no rales.  Abdominal: Soft. Normal appearance. There is no tenderness. There is no CVA tenderness.  Musculoskeletal: Normal range of motion.  Lymphadenopathy:    She has no cervical adenopathy.  Neurological: She is alert and oriented to person, place, and time.  Skin: Skin is warm, dry and intact. No lesion and no rash noted.  Psychiatric: She has a normal mood and affect. Her speech is normal and behavior is normal. Thought content normal.   BP 127/84 mmHg  Pulse 76  Temp(Src) 98.3 F (36.8 C) (Oral)  Resp 16  Ht 5' 9.5" (1.765 m)  Wt 294 lb (133.358 kg)  BMI 42.81 kg/m2  SpO2 98%  LMP 10/06/2015  Results for orders placed or performed in visit on 10/23/15  POCT Microscopic Urinalysis ([facility])  Result Value Ref Range   WBC,UR,HPF,POC None None WBC/hpf   RBC,UR,HPF,POC Moderate (A) None RBC/hpf   Bacteria None None, Too numerous to count   Mucus Absent Absent   Epithelial Cells, UR Per Microscopy None None, Too numerous to count cells/hpf  POCT urinalysis dipstick  Result Value Ref Range   Color, UA yellow yellow   Clarity, UA clear clear   Glucose, UA negative negative   Bilirubin, UA negative negative   Ketones, POC UA negative negative   Spec Grav, UA >=1.030    Blood, UA moderate (A) negative   pH, UA 5.0    Protein Ur, POC negative negative   Urobilinogen, UA 0.2    Nitrite, UA Negative Negative   Leukocytes, UA Negative Negative    Assessment and Plan:  1. Anxiety state Will start on buspar BID and see if helpful. Room to increase dose/increase frequency of dosing. Refilled xanax to use prn. Encouraged pt to start therapy to deal with anxiety, esp anxiety around driving. She will message me on mychart and let me know how buspar is working in a month or so. - ALPRAZolam (XANAX) 0.25 MG tablet; Take 1 to 1.5 tablets by mouth daily as needed.  Dispense: 45 tablet; Refill: 2 - busPIRone (BUSPAR) 10 MG tablet; Take 1 tablet (10  mg total) by mouth 2 (two) times daily.  Dispense: 60 tablet; Refill: 3  2. Suprapubic pressure, unspecified laterality 3. Hematuria  UA only with mod blood. Urine culture pending. Will follow up with pt by phone in 1 week. If urine culture clean, will want to get a repeat UA in 2-4 weeks to follow up on hematuria. - POCT Microscopic Urinalysis ([facility]) - POCT urinalysis dipstick - Urine culture   Benjaman Pott. Drenda Freeze, MHS [facility]  10/23/2015

## 2015-10-23 NOTE — Patient Instructions (Addendum)
Start buspar twice a day. You may take xanax as needed. I will call you with result of your urine culture Will send in antibiotic if needed. Drink plenty of water (at least 64 oz per day). Cranberry juice/pills can help. You can buy AZO over the counter and take as needed for pain. Follow directions on the packaging. If symptoms are not improving in 1 week or if symptoms worsen at any time, return to clinic.

## 2015-10-24 NOTE — Progress Notes (Signed)
You might consider giving her a strainer. Her symptoms may be consistent with trying to pass a stone.

## 2015-11-13 ENCOUNTER — Telehealth: Payer: Self-pay | Admitting: Physician Assistant

## 2015-11-13 DIAGNOSIS — R3129 Other microscopic hematuria: Secondary | ICD-10-CM

## 2015-11-13 NOTE — Telephone Encounter (Signed)
Can someone call pt and see how she is feeling? Unfortunately we did not get a urine culture results on pt (looks like lab error?) - see her lower abdominal pressure has resolved. If so, I would like her to come in so we can check her urine since it had blood in it. I will put in a future order and she can come in whenever convenient for her. If still has blood, I will refer to urology for further evaluation.

## 2015-11-14 NOTE — Telephone Encounter (Signed)
Spoke to pt and she said she is feeling better. The pressure has gone away. Pt will stop in at her convenience to leave a urine sample.

## 2015-12-17 ENCOUNTER — Telehealth: Payer: Self-pay

## 2015-12-17 NOTE — Telephone Encounter (Signed)
Pt requesting Triamcinolone ointment refill for her chronic Eczema   Best phone for pt is [address]

## 2015-12-18 NOTE — Telephone Encounter (Signed)
I am uncomfortable with creams, ointments. Please advise.

## 2015-12-19 MED ORDER — TRIAMCINOLONE ACETONIDE 0.1 % EX OINT: 1.0000 "application " | TOPICAL_OINTMENT | Freq: Two times a day (BID) | CUTANEOUS | Status: DC

## 2015-12-19 NOTE — Telephone Encounter (Signed)
I'll give her a courtesy refill but she should come in for recheck. It's not a good idea to use these regularly since steroid creams can thin out the skin.

## 2015-12-19 NOTE — Telephone Encounter (Signed)
Pt advised.

## 2016-01-28 ENCOUNTER — Telehealth: Payer: Self-pay

## 2016-01-28 DIAGNOSIS — F411 Generalized anxiety disorder: Secondary | ICD-10-CM

## 2016-01-28 NOTE — Telephone Encounter (Signed)
Pt states Victoria Ewing had put her on an antidepressant medicine besides the [facility] and she didn't get if filled at the time, but when went to the pharmacy they didn't have a record of it Please call [phone]

## 2016-01-30 MED ORDER — BUSPIRONE HCL 10 MG PO TABS: 10.0000 mg | ORAL_TABLET | Freq: Two times a day (BID) | ORAL | Status: DC

## 2016-01-30 NOTE — Telephone Encounter (Signed)
Re-sent Rx of Buspar and notified pt on VM.

## 2016-02-08 ENCOUNTER — Ambulatory Visit (INDEPENDENT_AMBULATORY_CARE_PROVIDER_SITE_OTHER): Payer: BC Managed Care – PPO | Admitting: Physician Assistant

## 2016-02-08 VITALS — BP 134/92 | HR 80 | Temp 98.1°F | Resp 16 | Ht 70.0 in | Wt 294.0 lb

## 2016-02-08 DIAGNOSIS — R319 Hematuria, unspecified: Secondary | ICD-10-CM | POA: Diagnosis not present

## 2016-02-08 DIAGNOSIS — J019 Acute sinusitis, unspecified: Secondary | ICD-10-CM | POA: Diagnosis not present

## 2016-02-08 DIAGNOSIS — R519 Headache, unspecified: Secondary | ICD-10-CM

## 2016-02-08 DIAGNOSIS — R51 Headache: Secondary | ICD-10-CM | POA: Diagnosis not present

## 2016-02-08 DIAGNOSIS — F411 Generalized anxiety disorder: Secondary | ICD-10-CM

## 2016-02-08 LAB — POCT URINALYSIS DIP (MANUAL ENTRY)
BILIRUBIN UA: NEGATIVE
BILIRUBIN UA: NEGATIVE
Glucose, UA: NEGATIVE
LEUKOCYTES UA: NEGATIVE
Nitrite, UA: NEGATIVE
PH UA: 5.5
Protein Ur, POC: NEGATIVE
SPEC GRAV UA: 1.02
Urobilinogen, UA: 0.2

## 2016-02-08 LAB — POC MICROSCOPIC URINALYSIS (UMFC)

## 2016-02-08 MED ORDER — AMOXICILLIN-POT CLAVULANATE 875-125 MG PO TABS: 1.0000 | ORAL_TABLET | Freq: Two times a day (BID) | ORAL | Status: AC

## 2016-02-08 NOTE — Patient Instructions (Addendum)
augmentin twice a day for 10 days. Take claritin daily Start back on flonase daily -- takes 2 weeks to reach maximal effectiveness. If you are still having headaches after abx, try excedrin or ibuprofen 600 mg as needed for headaches. When getting better, start on buspar twice a day for anxiety. Return to establish care with new provider after I leave -- Ivar Drape, Rosario Adie, Philis Fendt    IF you received an x-ray today, you will receive an invoice from [facility]. Please contact [facility] at [phone] with questions or concerns regarding your invoice.   IF you received labwork today, you will receive an invoice from [employer]. Please contact [facility] at [phone] with questions or concerns regarding your invoice.   Our billing staff will not be able to assist you with questions regarding bills from these companies.  You will be contacted with the lab results as soon as they are available. The fastest way to get your results is to activate your My Chart account. Instructions are located on the last page of this paperwork. If you have not heard from [place] regarding the results in 2 weeks, please contact this office.

## 2016-02-08 NOTE — Progress Notes (Signed)
[facility] [address] [place] [address] [phone]  Date:  02/08/2016   Name:  Victoria Ewing   DOB:  12-Oct-1975   MRN:  [id number]  PCP:  No PCP Per Patient    Chief Complaint: Sinusitis; Headache; and Wheezing   History of Present Illness:  This is a 40 y.o. female with PMH anxiety, asthma, allergic rhinitis who is presenting with 5 months of nasal congestion, worse allergies she's had. She is having more wheezing in the AMs. She is noticing daily frontal headaches x 1 month. Feeling a little dizzy, like off balance. Feels better in dim light. Mild phonophobia. Denies numbness, weakness, n/v. Takes claritin most day for allergies - not every day. Taking tylenol for headaches - not helping.  She feels her increased headaches are causing her to be more anxious. She has not yet started the buspar. Planning to start once headaches and dizziness get better. She continues to take xanax 1.5 tabs daily.  Mod blood on UA last visit -- will recheck today.  Review of Systems:  Review of Systems See HPI  Patient Active Problem List   Diagnosis Date Noted  . Eczema 12/14/2014  . Asthma, chronic 08/30/2014  . Anxiety state 07/22/2013  . Allergic rhinitis 07/22/2013    Prior to Admission medications   Medication Sig Start Date End Date Taking? Authorizing Provider  albuterol (PROVENTIL HFA;VENTOLIN HFA) 108 (90 BASE) MCG/ACT inhaler Inhale 2 puffs into the lungs every 6 (six) hours as needed for wheezing. 07/10/15  Yes Bennett Scrape V, PA-C  ALPRAZolam (XANAX) 0.25 MG tablet Take 1 to 1.5 tablets by mouth daily as needed. 10/23/15  Yes Bennett Scrape V, PA-C  busPIRone (BUSPAR) 10 MG tablet Take 1 tablet (10 mg total) by mouth 2 (two) times daily. 01/30/16  Yes Bennett Scrape V, PA-C  loratadine (CLARITIN) 10 MG tablet Take 10 mg by mouth daily.   Yes Historical Provider, MD  triamcinolone ointment (KENALOG) 0.1 % Apply 1 application topically 2 (two) times daily.  12/19/15  Yes Jaynee Eagles, PA-C  cholecalciferol (VITAMIN D) 1000 UNITS tablet Take 2,000 Units by mouth daily. Reported on 02/08/2016    Historical Provider, MD    Allergies  Allergen Reactions  . Other Anaphylaxis    Pecans and nuts     Past Surgical History  Procedure Laterality Date  . Cesarean section      Social History  Substance Use Topics  . Smoking status: Never Smoker   . Smokeless tobacco: Never Used  . Alcohol Use: No    Family History  Problem Relation Age of Onset  . Cancer Mother     Medication list has been reviewed and updated.  Physical Examination:  Physical Exam  Constitutional: She is oriented to person, place, and time. She appears well-developed and well-nourished. No distress.  HENT:  Head: Normocephalic and atraumatic.  Right Ear: Hearing, tympanic membrane, external ear and ear canal normal.  Left Ear: Hearing, tympanic membrane, external ear and ear canal normal.  Nose: Mucosal edema present. Right sinus exhibits no maxillary sinus tenderness and no frontal sinus tenderness. Left sinus exhibits no maxillary sinus tenderness and no frontal sinus tenderness.  Mouth/Throat: Uvula is midline, oropharynx is clear and moist and mucous membranes are normal.  Eyes: Conjunctivae, EOM and lids are normal. Pupils are equal, round, and reactive to light. Right eye exhibits no discharge. Left eye exhibits no discharge. No scleral icterus.  Cardiovascular: Normal rate, regular rhythm,  normal heart sounds and normal pulses.   No murmur heard. Pulmonary/Chest: Effort normal and breath sounds normal. No respiratory distress. She has no wheezes. She has no rhonchi. She has no rales.  Musculoskeletal: Normal range of motion.  Lymphadenopathy:       Head (right side): No submental, no submandibular and no tonsillar adenopathy present.       Head (left side): No submental, no submandibular and no tonsillar adenopathy present.    She has no cervical adenopathy.   Neurological: She is alert and oriented to person, place, and time. She has normal strength and normal reflexes. No cranial nerve deficit or sensory deficit. Gait normal.  Skin: Skin is warm, dry and intact. No lesion and no rash noted.  Psychiatric: She has a normal mood and affect. Her speech is normal and behavior is normal. Thought content normal.   BP 134/92 mmHg  Pulse 80  Temp(Src) 98.1 F (36.7 C) (Oral)  Resp 16  Ht 5\' 10"  (1.778 m)  Wt 294 lb (133.358 kg)  BMI 42.18 kg/m2  SpO2 99%  LMP 01/27/2016  Results for orders placed or performed in visit on 02/08/16  POCT Microscopic Urinalysis ([facility])  Result Value Ref Range   WBC,UR,HPF,POC Few (A) None WBC/hpf   RBC,UR,HPF,POC Few (A) None RBC/hpf   Bacteria Moderate (A) None, Too numerous to count   Mucus Present (A) Absent   Epithelial Cells, UR Per Microscopy Few (A) None, Too numerous to count cells/hpf  POCT urinalysis dipstick  Result Value Ref Range   Color, UA yellow yellow   Clarity, UA clear clear   Glucose, UA negative negative   Bilirubin, UA negative negative   Ketones, POC UA negative negative   Spec Grav, UA 1.020    Blood, UA trace-lysed (A) negative   pH, UA 5.5    Protein Ur, POC negative negative   Urobilinogen, UA 0.2    Nitrite, UA Negative Negative   Leukocytes, UA Negative Negative   Assessment and Plan:  1. Acute sinusitis, recurrence not specified, unspecified location 2. Acute headache Suspect headache d/t sinusitis. Treat with augmentin. Advised take claritin consistently and add flonase daily. If headaches persist after treatment, let me know. Also, may try ibuprofen 600 mg or excedrin for headaches and see if more effective than tylenol. Neuro exam normal. - amoxicillin-clavulanate (AUGMENTIN) 875-125 MG tablet; Take 1 tablet by mouth 2 (two) times daily.  Dispense: 20 tablet; Refill: 0  3. Hematuria Persistent hematuria. Urine culture pending. If no growth, will send to urology for  further eval. - POCT Microscopic Urinalysis ([facility]) - POCT urinalysis dipstick - Urine culture  4. Anxiety state Pt will start buspar after sinus infection treated.    Benjaman Pott Drenda Freeze, MHS [facility]  02/08/2016

## 2016-02-09 LAB — URINE CULTURE
COLONY COUNT: NO GROWTH
ORGANISM ID, BACTERIA: NO GROWTH

## 2016-02-12 ENCOUNTER — Other Ambulatory Visit: Payer: Self-pay | Admitting: Physician Assistant

## 2016-02-12 DIAGNOSIS — R319 Hematuria, unspecified: Secondary | ICD-10-CM

## 2016-02-18 ENCOUNTER — Other Ambulatory Visit: Payer: Self-pay | Admitting: Physician Assistant

## 2016-02-19 NOTE — Telephone Encounter (Signed)
Faxed

## 2016-03-14 ENCOUNTER — Telehealth: Payer: Self-pay | Admitting: *Deleted

## 2016-03-14 NOTE — Telephone Encounter (Signed)
Pt called and left message on lab line and her last visit was in May.  Can we call her to see what she needs? [phone]

## 2016-03-17 ENCOUNTER — Telehealth: Payer: Self-pay

## 2016-03-17 NOTE — Telephone Encounter (Signed)
LMTRC jp/cma  

## 2016-03-17 NOTE — Telephone Encounter (Signed)
The urine culture revealed NO infection, so an antibiotic won't help in this situation.  If Victoria Ewing has NEW or worsening symptoms, Victoria Ewing should return for re-evaluation.  Microscopic blood in the urine needs evaluation with the specialist, but it's ok to wait until the July appointment.  In the meantime, Victoria Ewing should work to stay well hydrated, and urinate when Victoria Ewing needs to (not holding her urine).

## 2016-03-17 NOTE — Telephone Encounter (Signed)
Pt. Saw Victoria Ewing back in May and she is concerned, because she got her appt for urology but the appt. Will not be till July 12th, she would like someone to look over her lab results again and find out if there is any signs of a bladder infection that can be treated with antibiotics before her appt. Because she is still have the frequency, but pressure but no burning sensation and she does not physically see blood in her urine when she is home. She leaves town tues. Night and would like to know what to do before she leaves please let us know.  If [facility] is sent [facility] [place].  Best call back number is [phone]

## 2016-03-19 NOTE — Telephone Encounter (Signed)
Pt advised.

## 2016-05-18 IMAGING — US US SOFT TISSUE HEAD/NECK
1 series · 4 of 4 positions shown · non-contrast
Comparison: None.

CLINICAL DATA: Soft tissue mass in the posterior right lower neck

EXAM:
ULTRASOUND OF HEAD/NECK SOFT TISSUES
TECHNIQUE: Ultrasound examination of the head and neck soft tissues was
performed in the area of clinical concern.

[Series 1: us soft tissue head/neck · 0.09mm/px · 4 of 4 slices shown]
[im 1/4]
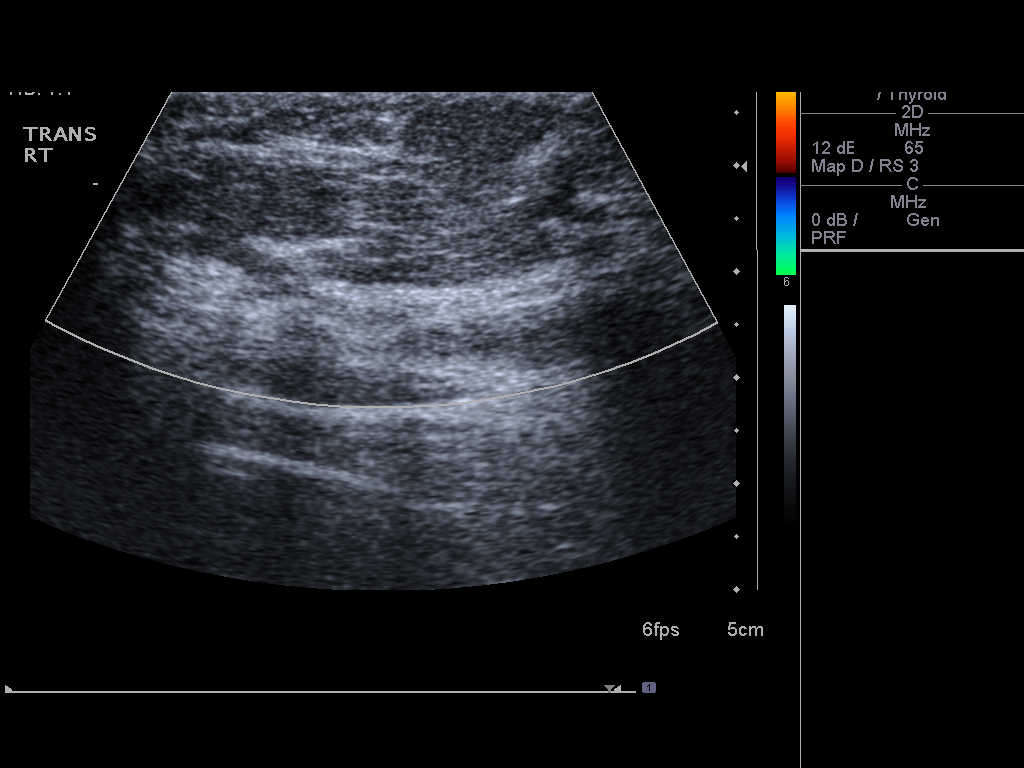
[im 2/4]
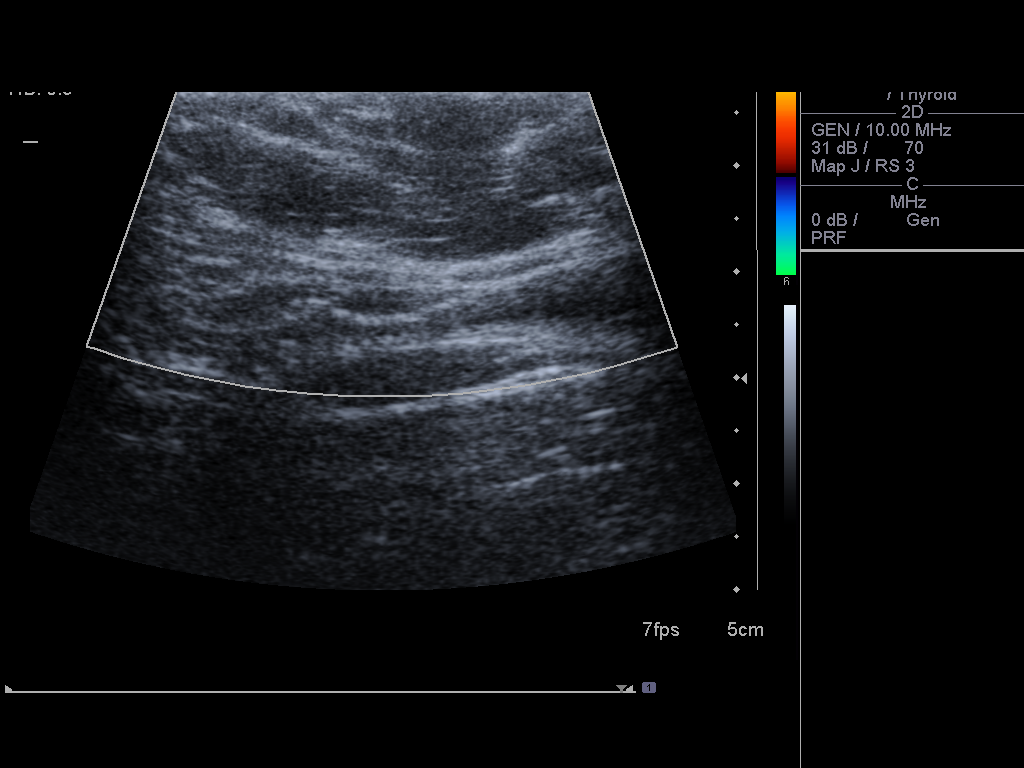
[im 3/4]
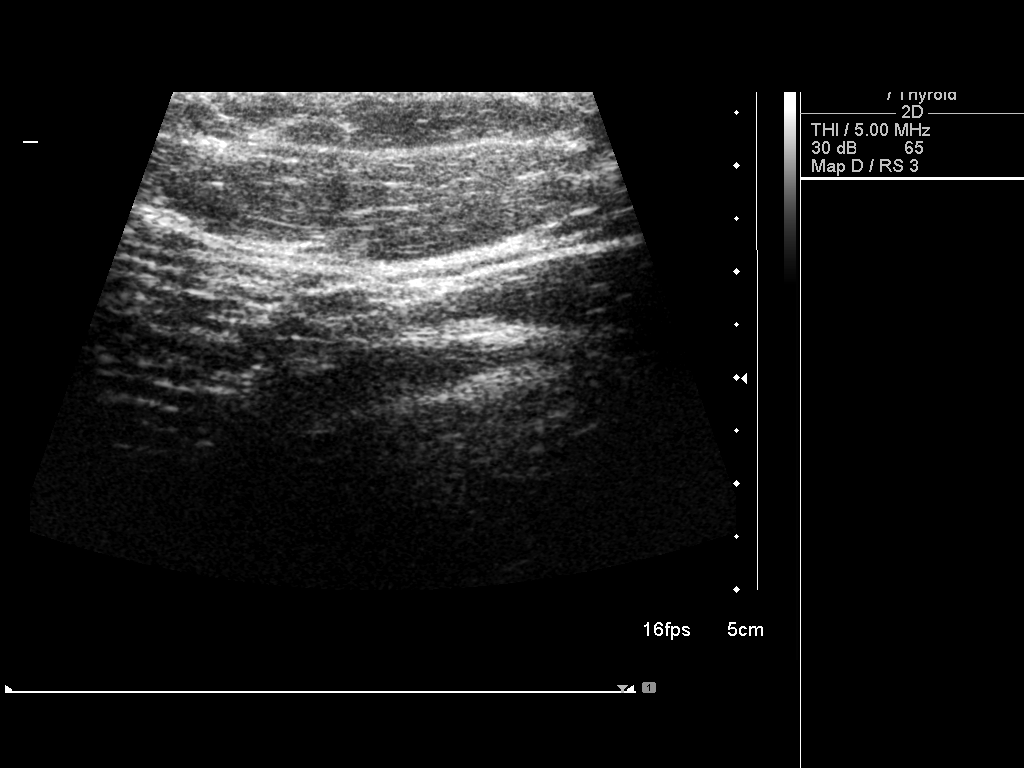
[im 4/4]
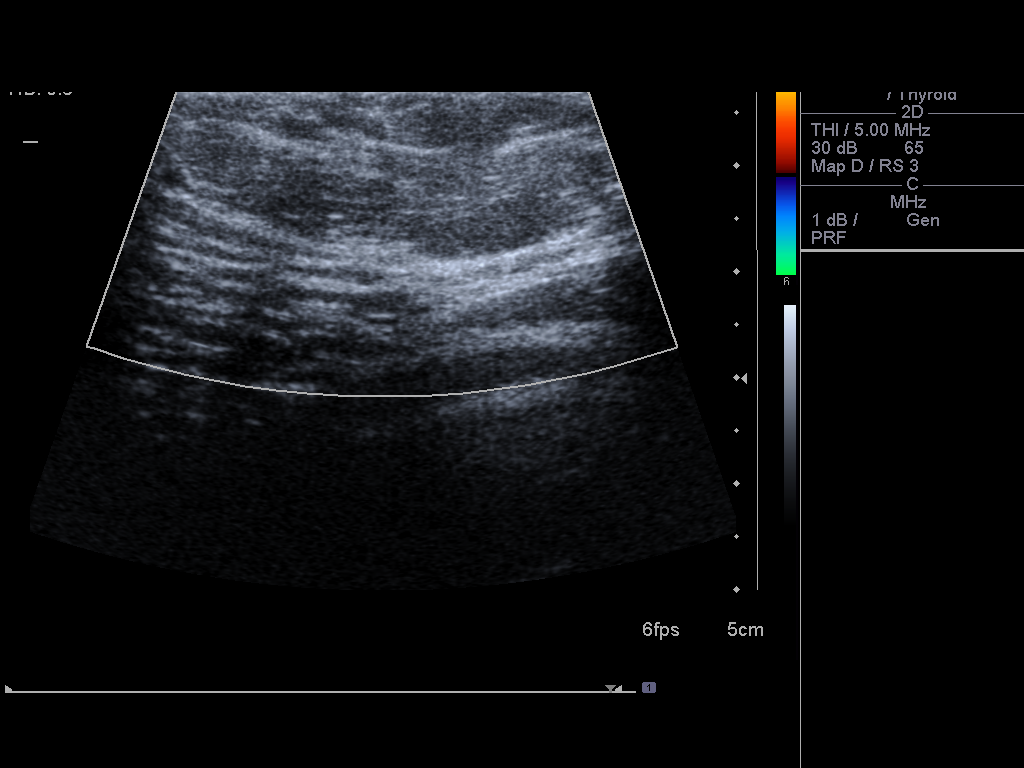

[4 of 4 positions shown; findings below may reference images not displayed]

FINDINGS: Ultrasound over the palpable abnormality in the lower neck -upper
back slightly to the right of midline was performed. At that site
there is an oval soft tissue structure immediately subcutaneous. It
is not as hyper echogenic as a lipoma is generally noted to be by
ultrasound. However by palpation this area is soft and pliable and
there is no internal blood flow by ultrasound. Therefore this is
most consistent with a lipoma. Clinical followup is recommended.
IMPRESSION: The area questioned in the lower right neck - upper back is most
consistent with a lipoma.

## 2016-07-02 ENCOUNTER — Other Ambulatory Visit: Payer: Self-pay | Admitting: Physician Assistant

## 2016-07-03 NOTE — Telephone Encounter (Signed)
Patient is going out of town tomorrow and needs her xanax filled ASAP.   Please call [phone] (H)

## 2016-07-04 ENCOUNTER — Telehealth: Payer: Self-pay

## 2016-07-04 MED ORDER — ALPRAZOLAM 0.25 MG PO TABS: ORAL_TABLET | ORAL | 0 refills | Status: DC

## 2016-07-04 NOTE — Telephone Encounter (Signed)
Meds ordered this encounter  Medications  . ALPRAZolam (XANAX) 0.25 MG tablet    Sig: TAKE 1 TO 1 AND 1/2 TABLETS BY MOUTH EVERY DAY AS NEEDED    Dispense:  45 tablet    Refill:  0    Not to exceed 3 additional fills before 04/20/2016.    Order Specific Question:   Supervising Provider    Answer:   Brigitte Pulse, EVA N [[id number]]    1 month supply. Needs to establish with new provider for any additional fills this medication.

## 2016-07-04 NOTE — Telephone Encounter (Signed)
Last OV for anxiety 02/08/16, last Rx written 5/16 for #45 + 2 RFs. Please review in Nicole's absence

## 2016-07-04 NOTE — Telephone Encounter (Signed)
Patient stated her medication was denied. Patient want to know if she need to come in for an office visit. Patient stated she is going out of town today. She need her medication because he have anxiety and she is getting on the road.     [phone].

## 2016-07-04 NOTE — Telephone Encounter (Signed)
Victoria Ewing has denied refill (refill enc 9/27) - pt needs to est w/new provider. Do you want to give pt a few tablets to give her time to get back into town and in for appt?

## 2016-07-07 NOTE — Telephone Encounter (Addendum)
[facility]. [facility] tried to call [facility] and number is disconnected.

## 2016-08-11 ENCOUNTER — Ambulatory Visit (INDEPENDENT_AMBULATORY_CARE_PROVIDER_SITE_OTHER): Payer: BC Managed Care – PPO | Admitting: Family Medicine

## 2016-08-11 ENCOUNTER — Encounter: Payer: Self-pay | Admitting: Family Medicine

## 2016-08-11 VITALS — BP 130/80 | HR 82 | Temp 98.3°F | Resp 17 | Ht 69.5 in | Wt 299.0 lb

## 2016-08-11 DIAGNOSIS — F321 Major depressive disorder, single episode, moderate: Secondary | ICD-10-CM | POA: Diagnosis not present

## 2016-08-11 DIAGNOSIS — L2082 Flexural eczema: Secondary | ICD-10-CM | POA: Diagnosis not present

## 2016-08-11 DIAGNOSIS — Z72821 Inadequate sleep hygiene: Secondary | ICD-10-CM | POA: Diagnosis not present

## 2016-08-11 DIAGNOSIS — Z131 Encounter for screening for diabetes mellitus: Secondary | ICD-10-CM

## 2016-08-11 DIAGNOSIS — R221 Localized swelling, mass and lump, neck: Secondary | ICD-10-CM | POA: Diagnosis not present

## 2016-08-11 DIAGNOSIS — G43009 Migraine without aura, not intractable, without status migrainosus: Secondary | ICD-10-CM | POA: Diagnosis not present

## 2016-08-11 DIAGNOSIS — F411 Generalized anxiety disorder: Secondary | ICD-10-CM

## 2016-08-11 LAB — BASIC METABOLIC PANEL
BUN: 14 mg/dL (ref 7–25)
CO2: 23 mmol/L (ref 20–31)
Calcium: 9 mg/dL (ref 8.6–10.2)
Chloride: 104 mmol/L (ref 98–110)
Creat: 0.76 mg/dL (ref 0.50–1.10)
GLUCOSE: 98 mg/dL (ref 65–99)
POTASSIUM: 4.1 mmol/L (ref 3.5–5.3)
Sodium: 137 mmol/L (ref 135–146)

## 2016-08-11 LAB — LIPID PANEL
Cholesterol: 136 mg/dL (ref <200)
HDL: 47 mg/dL — AB (ref >50)
LDL CALC: 64 mg/dL
TRIGLYCERIDES: 127 mg/dL (ref <150)
Total CHOL/HDL Ratio: 2.9 Ratio (ref <5.0)
VLDL: 25 mg/dL (ref <30)

## 2016-08-11 LAB — CBC
HEMATOCRIT: 41 % (ref 35.0–45.0)
Hemoglobin: 13 g/dL (ref 11.7–15.5)
MCH: 27.9 pg (ref 27.0–33.0)
MCHC: 31.7 g/dL — ABNORMAL LOW (ref 32.0–36.0)
MCV: 88 fL (ref 80.0–100.0)
MPV: 9.5 fL (ref 7.5–12.5)
PLATELETS: 289 10*3/uL (ref 140–400)
RBC: 4.66 MIL/uL (ref 3.80–5.10)
RDW: 13.4 % (ref 11.0–15.0)
WBC: 4.8 10*3/uL (ref 3.8–10.8)

## 2016-08-11 LAB — TSH: TSH: 1.1 mIU/L

## 2016-08-11 MED ORDER — NAPROXEN 500 MG PO TABS: 500.0000 mg | ORAL_TABLET | Freq: Two times a day (BID) | ORAL | 0 refills | Status: DC

## 2016-08-11 MED ORDER — HYDROCORTISONE 2.5 % EX CREA: TOPICAL_CREAM | Freq: Two times a day (BID) | CUTANEOUS | 0 refills | Status: DC

## 2016-08-11 MED ORDER — BUPROPION HCL 75 MG PO TABS: 75.0000 mg | ORAL_TABLET | Freq: Two times a day (BID) | ORAL | 0 refills | Status: DC

## 2016-08-11 NOTE — Patient Instructions (Addendum)
Starting instruction for Wellbutrin Take one tablet by mouth every morning Continue for 3 days After that if there are no serious side effects then take 1 tablet in the morning then one in the PM If you are able to tolerate the medication  Take 2 tablets in the morning and 2 in the PM  When you get to the full dose of 150mg  twice a day return to clinic for a refill    IF you received an x-ray today, you will receive an invoice from [facility]. Please contact [facility] at [phone] with questions or concerns regarding your invoice.   IF you received labwork today, you will receive an invoice from [employer]. Please contact [facility] at [phone] with questions or concerns regarding your invoice.   Our billing staff will not be able to assist you with questions regarding bills from these companies.  You will be contacted with the lab results as soon as they are available. The fastest way to get your results is to activate your My Chart account. Instructions are located on the last page of this paperwork. If you have not heard from [place] regarding the results in 2 weeks, please contact this office.      Exercising to Lose Weight Exercising can help you to lose weight. In order to lose weight through exercise, you need to do vigorous-intensity exercise. You can tell that you are exercising with vigorous intensity if you are breathing very hard and fast and cannot hold a conversation while exercising. Moderate-intensity exercise helps to maintain your current weight. You can tell that you are exercising at a moderate level if you have a higher heart rate and faster breathing, but you are still able to hold a conversation. HOW OFTEN SHOULD I EXERCISE? Choose an activity that you enjoy and set realistic goals. Your health care provider can help you to make an activity plan that works for you. Exercise regularly as directed by your health care  provider. This may include:  Doing resistance training twice each week, such as:  Push-ups.  Sit-ups.  Lifting weights.  Using resistance bands.  Doing a given intensity of exercise for a given amount of time. Choose from these options:  150 minutes of moderate-intensity exercise every week.  75 minutes of vigorous-intensity exercise every week.  A mix of moderate-intensity and vigorous-intensity exercise every week. Children, pregnant women, people who are out of shape, people who are overweight, and older adults may need to consult a health care provider for individual recommendations. If you have any sort of medical condition, be sure to consult your health care provider before starting a new exercise program. WHAT ARE SOME ACTIVITIES THAT CAN HELP ME TO LOSE WEIGHT?   Walking at a rate of at least 4.5 miles an hour.  Jogging or running at a rate of 5 miles per hour.  Biking at a rate of at least 10 miles per hour.  Lap swimming.  Roller-skating or in-line skating.  Cross-country skiing.  Vigorous competitive sports, such as football, basketball, and soccer.  Jumping rope.  Aerobic dancing. HOW CAN I BE MORE ACTIVE IN MY DAY-TO-DAY ACTIVITIES?  Use the stairs instead of the elevator.  Take a walk during your lunch break.  If you drive, park your car farther away from work or school.  If you take public transportation, get off one stop early and walk the rest of the way.  Make all of your phone calls while standing up and  walking around.  Get up, stretch, and walk around every 30 minutes throughout the day. WHAT GUIDELINES SHOULD I FOLLOW WHILE EXERCISING?  Do not exercise so much that you hurt yourself, feel dizzy, or get very short of breath.  Consult your health care provider prior to starting a new exercise program.  Wear comfortable clothes and shoes with good support.  Drink plenty of water while you exercise to prevent dehydration or heat stroke.  Body water is lost during exercise and must be replaced.  Work out until you breathe faster and your heart beats faster.   This information is not intended to replace advice given to you by your health care provider. Make sure you discuss any questions you have with your health care provider.   Document Released: 10/25/2010 Document Revised: 10/13/2014 Document Reviewed: 02/23/2014 Elsevier Interactive Patient Education 2016 [employer].  Migraine Headache A migraine headache is an intense, throbbing pain on one or both sides of your head. A migraine can last for 30 minutes to several hours. CAUSES  The exact cause of a migraine headache is not always known. However, a migraine may be caused when nerves in the brain become irritated and release chemicals that cause inflammation. This causes pain. Certain things may also trigger migraines, such as:  Alcohol.  Smoking.  Stress.  Menstruation.  Aged cheeses.  Foods or drinks that contain nitrates, glutamate, aspartame, or tyramine.  Lack of sleep.  Chocolate.  Caffeine.  Hunger.  Physical exertion.  Fatigue.  Medicines used to treat chest pain (nitroglycerine), birth control pills, estrogen, and some blood pressure medicines. SIGNS AND SYMPTOMS  Pain on one or both sides of your head.  Pulsating or throbbing pain.  Severe pain that prevents daily activities.  Pain that is aggravated by any physical activity.  Nausea, vomiting, or both.  Dizziness.  Pain with exposure to bright lights, loud noises, or activity.  General sensitivity to bright lights, loud noises, or smells. Before you get a migraine, you may get warning signs that a migraine is coming (aura). An aura may include:  Seeing flashing lights.  Seeing bright spots, halos, or zigzag lines.  Having tunnel vision or blurred vision.  Having feelings of numbness or tingling.  Having trouble talking.  Having muscle weakness. DIAGNOSIS  A  migraine headache is often diagnosed based on:  Symptoms.  Physical exam.  A CT scan or MRI of your head. These imaging tests cannot diagnose migraines, but they can help rule out other causes of headaches. TREATMENT Medicines may be given for pain and nausea. Medicines can also be given to help prevent recurrent migraines.  HOME CARE INSTRUCTIONS  Only take over-the-counter or prescription medicines for pain or discomfort as directed by your health care provider. The use of long-term narcotics is not recommended.  Lie down in a dark, quiet room when you have a migraine.  Keep a journal to find out what may trigger your migraine headaches. For example, write down:  What you eat and drink.  How much sleep you get.  Any change to your diet or medicines.  Limit alcohol consumption.  Quit smoking if you smoke.  Get 7-9 hours of sleep, or as recommended by your health care provider.  Limit stress.  Keep lights dim if bright lights bother you and make your migraines worse. SEEK IMMEDIATE MEDICAL CARE IF:   Your migraine becomes severe.  You have a fever.  You have a stiff neck.  You have vision loss.  You  have muscular weakness or loss of muscle control.  You start losing your balance or have trouble walking.  You feel faint or pass out.  You have severe symptoms that are different from your first symptoms. MAKE SURE YOU:   Understand these instructions.  Will watch your condition.  Will get help right away if you are not doing well or get worse.   This information is not intended to replace advice given to you by your health care provider. Make sure you discuss any questions you have with your health care provider.   Document Released: 09/22/2005 Document Revised: 10/13/2014 Document Reviewed: 05/30/2013 Elsevier Interactive Patient Education [employer].

## 2016-08-11 NOTE — Progress Notes (Signed)
Chief Complaint  Patient presents with  . Headache  . Dizziness    HPI  Migraine Headache Pt reports that from the moment she wakes up she feels a heaviness mainly in her face and eyes with pressure and dizziness feeling off balance where she feels like she is going to fall She has a history of worsening anxiety that went from being more phobic about driving to increasing anxiety about everything. She reports that she was worried about blood pressure issues and problems with her weight causing the headache.  In the past month She reports daily headaches With Photophobia No phonophobia Focusing is difficult She reports that the vision is sometimes blurry She wears her glasses but felt like everything was too magnified She reports that she previously headaches that were sporadic but now has progressed to daily.  Patient's last menstrual period was 07/23/2016 (approximate). She reports that she is abstinent  She reports that she does not use hormonal contraception She does not see any association with her periods She takes claritin as needed for allergies  She takes tylenol for headaches  Morbid Obesity She reports that she has had a significant weight gain since 2014 In February 2015 she was 270 lbs She reports that she grazes on snacks She eats healthy meals  But knows that she needs portion control She also has poor sleep pattern   Poor sleep hygiene/Anxiety/Depression She reports that at night after getting her daughter to bed she has difficulty with falling asleep She reports that she had emotional stress She reports that she was recently caring for her mother who passed from pancreatic cancer a year ago She has a 30 year old daughter When she moved her in 2014 it was to be with family.  She reports that her family moved away and she was the remaining adult child who had to care for her mother daily until she died.  She reports that the anniversary of her mother's death is  approaching.   GAD 7 : Generalized Anxiety Score 08/11/2016  Nervous, Anxious, on Edge 3  Control/stop worrying 2  Worry too much - different things 2  Trouble relaxing 2  Restless 0  Easily annoyed or irritable 2  Afraid - awful might happen 0  Total GAD 7 Score 11  Anxiety Difficulty Not difficult at all    Depression screen [facility] 2/9 08/11/2016 02/08/2016 10/23/2015 07/10/2015 09/26/2014  Decreased Interest 2 0 0 0 0  Down, Depressed, Hopeless 3 0 0 0 0  PHQ - 2 Score 5 0 0 0 0  Altered sleeping 2 - - - -  Tired, decreased energy 3 - - - -  Change in appetite 2 - - - -  Feeling bad or failure about yourself  2 - - - -  Trouble concentrating 0 - - - -  Moving slowly or fidgety/restless 1 - - - -  Suicidal thoughts 0 - - - -  PHQ-9 Score 15 - - - -      Past Medical History:  Diagnosis Date  . Arthritis     Current Outpatient Prescriptions  Medication Sig Dispense Refill  . albuterol (PROVENTIL HFA;VENTOLIN HFA) 108 (90 BASE) MCG/ACT inhaler Inhale 2 puffs into the lungs every 6 (six) hours as needed for wheezing. 1 Inhaler 3  . ALPRAZolam (XANAX) 0.25 MG tablet TAKE 1 TO 1 AND 1/2 TABLETS BY MOUTH EVERY DAY AS NEEDED 45 tablet 0  . loratadine (CLARITIN) 10 MG tablet Take 10 mg by  mouth daily.    Marland Kitchen triamcinolone ointment (KENALOG) 0.1 % Apply 1 application topically 2 (two) times daily. 30 g 0  . buPROPion (WELLBUTRIN) 75 MG tablet Take 1 tablet (75 mg total) by mouth 2 (two) times daily. 60 tablet 0  . busPIRone (BUSPAR) 10 MG tablet Take 1 tablet (10 mg total) by mouth 2 (two) times daily. (Patient not taking: Reported on 08/11/2016) 60 tablet 3  . cholecalciferol (VITAMIN D) 1000 UNITS tablet Take 2,000 Units by mouth daily. Reported on 02/08/2016    . hydrocortisone 2.5 % cream Apply topically 2 (two) times daily. 30 g 0  . naproxen (NAPROSYN) 500 MG tablet Take 1 tablet (500 mg total) by mouth 2 (two) times daily with a meal. 30 tablet 0   No current facility-administered  medications for this visit.     Allergies:  Allergies  Allergen Reactions  . Other Anaphylaxis    Pecans and nuts     Past Surgical History:  Procedure Laterality Date  . CESAREAN SECTION      Social History   Social History  . Marital status: Single    Spouse name: N/A  . Number of children: N/A  . Years of education: N/A   Social History Main Topics  . Smoking status: Never Smoker  . Smokeless tobacco: Never Used  . Alcohol use No  . Drug use: No  . Sexual activity: Not Currently   Other Topics Concern  . None   Social History Narrative  . None    Review of Systems  Constitutional: Negative for chills, diaphoresis, fever, malaise/fatigue and weight loss.  HENT: Positive for tinnitus. Negative for hearing loss.   Eyes: Positive for photophobia. Negative for blurred vision, double vision and pain.  Respiratory: Negative for cough and wheezing.   Cardiovascular: Negative for chest pain, palpitations and PND.  Gastrointestinal: Negative for abdominal pain, constipation, diarrhea, nausea and vomiting.  Genitourinary: Negative for dysuria and urgency.  Musculoskeletal: Negative for joint pain and myalgias.  Skin: Positive for itching.       2 areas of dry skin patches behind her legs  Neurological: Negative for weakness.  Psychiatric/Behavioral: Positive for depression. Negative for substance abuse and suicidal ideas. The patient is nervous/anxious and has insomnia.     Objective: Vitals:   08/11/16 0859  BP: 130/80  Pulse: 82  Resp: 17  Temp: 98.3 F (36.8 C)  TempSrc: Oral  SpO2: 99%  Weight: 299 lb (135.6 kg)  Height: 5' 9.5" (1.765 m)    Physical Exam  Constitutional: She is oriented to person, place, and time. She appears well-developed and well-nourished.  HENT:  Head: Normocephalic and atraumatic.  Right Ear: External ear normal.  Left Ear: External ear normal.  Nose: Nose normal.  Mouth/Throat: Oropharynx is clear and moist.  Eyes:  Conjunctivae and EOM are normal. Pupils are equal, round, and reactive to light.  Limited fundoscopic exam shows normal vessels, no nicking, no papilledema  Neck: Normal range of motion. No thyromegaly present.  Fat pad noted at the base of neck, nontender  Cardiovascular: Normal rate, regular rhythm and normal heart sounds.   Pulmonary/Chest: Effort normal and breath sounds normal. She has no wheezes.  Abdominal: Soft. Bowel sounds are normal. She exhibits no distension.  Musculoskeletal: Normal range of motion. She exhibits no edema.  Neurological: She is alert and oriented to person, place, and time. She displays normal reflexes. No cranial nerve deficit or sensory deficit. She exhibits normal muscle tone. Coordination normal.  Skin: Skin is warm. Capillary refill takes less than 2 seconds. No erythema.    Assessment and Plan Victoria Ewing was seen today for headache and dizziness.  Diagnoses and all orders for this visit:  Migraine without aura and without status migrainosus, not intractable- recommended Naproxen with food to alternate with tylenol Once anxiety and depression and sleep are properly addressed will discussed headache diary and prophylactic medication Plus the patient is very sensitive to new medications and the side effects of a daily medication for migraines could make it difficult to tell which medication is causing side effects -     naproxen (NAPROSYN) 500 MG tablet; Take 1 tablet (500 mg total) by mouth 2 (two) times daily with a meal.  Generalized anxiety disorder- meets criteria for generalized anxiety Given her obesity and depression will use wellbutrin and advised to increase to full dose and to use Buspar as needed Will plan to add Buspar daily if the wellbutrin alone is not enough -     CBC -     buPROPion (WELLBUTRIN) 75 MG tablet; Take 1 tablet (75 mg total) by mouth 2 (two) times daily.  Morbid obesity ([place])- discussed daily exercise for at least 20 minutes a  day Recommended workout programs on youtube Will screen for metabolic and endocrine factors -     TSH -     Lipid panel -     Hemoglobin A1c  Poor sleep hygiene- discussed melatonin, sleepy time tea and trying to develop better bedtime habits -     CBC  Screening for diabetes mellitus (DM) -     Hemoglobin A1c -     Basic metabolic panel  Moderate single current episode of major depressive disorder ([place])- will try wellbutrin for anxiety and depression Recommended daily exercise  Neck mass- previously worked up   Flexural eczema- advised to stop triamcinolone which thins the skin Taper down the hydrocortisone  Other orders -     hydrocortisone 2.5 % cream; Apply topically 2 (two) times daily.     A total of 40 minutes were spent face-to-face with the patient during this encounter and over half of that time was spent on counseling and coordination of care.   [place]

## 2016-08-12 LAB — HEMOGLOBIN A1C
Hgb A1c MFr Bld: 5.4 % (ref <5.7)
Mean Plasma Glucose: 108 mg/dL

## 2016-09-10 ENCOUNTER — Other Ambulatory Visit: Payer: Self-pay | Admitting: Family Medicine

## 2016-09-10 ENCOUNTER — Ambulatory Visit (INDEPENDENT_AMBULATORY_CARE_PROVIDER_SITE_OTHER): Payer: BC Managed Care – PPO | Admitting: Family Medicine

## 2016-09-10 ENCOUNTER — Encounter: Payer: Self-pay | Admitting: Family Medicine

## 2016-09-10 VITALS — BP 128/82 | HR 70 | Temp 98.8°F | Resp 18 | Ht 69.5 in | Wt 302.0 lb

## 2016-09-10 DIAGNOSIS — Z5181 Encounter for therapeutic drug level monitoring: Secondary | ICD-10-CM

## 2016-09-10 DIAGNOSIS — F411 Generalized anxiety disorder: Secondary | ICD-10-CM

## 2016-09-10 MED ORDER — ALPRAZOLAM 0.25 MG PO TABS: ORAL_TABLET | ORAL | 0 refills | Status: DC

## 2016-09-10 NOTE — Progress Notes (Unsigned)
Reprinted due to printer error

## 2016-09-10 NOTE — Patient Instructions (Signed)
     IF you received an x-ray today, you will receive an invoice from [facility]. Please contact [facility] at [phone] with questions or concerns regarding your invoice.   IF you received labwork today, you will receive an invoice from [facility]. Please contact [facility] at [phone] with questions or concerns regarding your invoice.   Our billing staff will not be able to assist you with questions regarding bills from these companies.  You will be contacted with the lab results as soon as they are available. The fastest way to get your results is to activate your My Chart account. Instructions are located on the last page of this paperwork. If you have not heard from us regarding the results in 2 weeks, please contact this office.      

## 2016-09-10 NOTE — Progress Notes (Signed)
Chief Complaint  Patient presents with  . Follow-up    questions about medication interactions, wellbutrin, xanax     HPI  Anxiety Pt reports that she has not started her Wellbutrin. She reports that she states that the insert from the medication said that wellbutrin should not be taken if you suddenly stop xanax. She reports that she did not take any of her xanax over the weekend  Morbid Obesity She has not been able to add in any exercise. She reports that she has not been able to change her eating patterns as yet. She states that she is still stress eating sometimes especially at night. Wt Readings from Last 3 Encounters:  09/10/16 (!) 302 lb (137 kg)  08/11/16 299 lb (135.6 kg)  02/08/16 294 lb (133.4 kg)     Past Medical History:  Diagnosis Date  . Arthritis     Current Outpatient Prescriptions  Medication Sig Dispense Refill  . albuterol (PROVENTIL HFA;VENTOLIN HFA) 108 (90 BASE) MCG/ACT inhaler Inhale 2 puffs into the lungs every 6 (six) hours as needed for wheezing. 1 Inhaler 3  . ALPRAZolam (XANAX) 0.25 MG tablet TAKE 1 TO 1 AND 1/2 TABLETS BY MOUTH EVERY DAY AS NEEDED 45 tablet 0  . hydrocortisone 2.5 % cream Apply topically 2 (two) times daily. 30 g 0  . loratadine (CLARITIN) 10 MG tablet Take 10 mg by mouth daily.    . naproxen (NAPROSYN) 500 MG tablet Take 1 tablet (500 mg total) by mouth 2 (two) times daily with a meal. 30 tablet 0  . triamcinolone ointment (KENALOG) 0.1 % Apply 1 application topically 2 (two) times daily. 30 g 0  . buPROPion (WELLBUTRIN) 75 MG tablet Take 1 tablet (75 mg total) by mouth 2 (two) times daily. (Patient not taking: Reported on 09/10/2016) 60 tablet 0  . busPIRone (BUSPAR) 10 MG tablet Take 1 tablet (10 mg total) by mouth 2 (two) times daily. (Patient not taking: Reported on 09/10/2016) 60 tablet 3  . cholecalciferol (VITAMIN D) 1000 UNITS tablet Take 2,000 Units by mouth daily. Reported on 02/08/2016     No current  facility-administered medications for this visit.     Allergies:  Allergies  Allergen Reactions  . Other Anaphylaxis    Pecans and nuts     Past Surgical History:  Procedure Laterality Date  . CESAREAN SECTION      Social History   Social History  . Marital status: Single    Spouse name: N/A  . Number of children: N/A  . Years of education: N/A   Social History Main Topics  . Smoking status: Never Smoker  . Smokeless tobacco: Never Used  . Alcohol use No  . Drug use: No  . Sexual activity: Not Currently   Other Topics Concern  . None   Social History Narrative  . None    ROS  Objective: Vitals:   09/10/16 0851  BP: 128/82  Pulse: 70  Resp: 18  Temp: 98.8 F (37.1 C)  TempSrc: Oral  SpO2: 98%  Weight: (!) 302 lb (137 kg)  Height: 5' 9.5" (1.765 m)  Body mass index is 43.96 kg/m.   Physical Exam  Constitutional: She is oriented to person, place, and time. She appears well-developed and well-nourished.  HENT:  Head: Normocephalic and atraumatic.  Eyes: Conjunctivae and EOM are normal.  Neck: Normal range of motion.  Cardiovascular: Normal rate, regular rhythm, normal heart sounds and intact distal pulses.   No murmur heard. Pulmonary/Chest: Effort  normal and breath sounds normal. No respiratory distress. She has no wheezes.  Musculoskeletal: Normal range of motion. She exhibits no edema.  Neurological: She is alert and oriented to person, place, and time.  Skin: Skin is warm. Capillary refill takes less than 2 seconds.    Assessment and Plan Kelsee was seen today for follow-up.  Diagnoses and all orders for this visit:  Generalized anxiety disorder- advised pt to continue xanax for now Add exercise Add wellbutrin for daily anxiety treatment   Encounter for medication monitoring- discussed that she should continue her xanax for now to avoid withdrawal and increasing her seizure risk She reports that she should also continue   Morbid  obesity ([place])- unimproved No lifestyle modifications as yet     [place]

## 2016-10-22 ENCOUNTER — Ambulatory Visit: Payer: BC Managed Care – PPO | Admitting: Family Medicine

## 2016-11-05 ENCOUNTER — Encounter: Payer: Self-pay | Admitting: Family Medicine

## 2016-11-05 ENCOUNTER — Ambulatory Visit (INDEPENDENT_AMBULATORY_CARE_PROVIDER_SITE_OTHER): Payer: BC Managed Care – PPO | Admitting: Family Medicine

## 2016-11-05 VITALS — BP 137/87 | HR 80 | Temp 98.3°F | Resp 16 | Ht 69.5 in | Wt 298.0 lb

## 2016-11-05 DIAGNOSIS — F411 Generalized anxiety disorder: Secondary | ICD-10-CM | POA: Diagnosis not present

## 2016-11-05 DIAGNOSIS — M722 Plantar fascial fibromatosis: Secondary | ICD-10-CM

## 2016-11-05 DIAGNOSIS — J4520 Mild intermittent asthma, uncomplicated: Secondary | ICD-10-CM

## 2016-11-05 DIAGNOSIS — M79672 Pain in left foot: Secondary | ICD-10-CM | POA: Diagnosis not present

## 2016-11-05 MED ORDER — ALBUTEROL SULFATE HFA 108 (90 BASE) MCG/ACT IN AERS: 2.0000 | INHALATION_SPRAY | Freq: Four times a day (QID) | RESPIRATORY_TRACT | 1 refills | Status: DC | PRN

## 2016-11-05 MED ORDER — BUPROPION HCL 75 MG PO TABS: 75.0000 mg | ORAL_TABLET | Freq: Two times a day (BID) | ORAL | 6 refills | Status: DC

## 2016-11-05 MED ORDER — ALPRAZOLAM 0.25 MG PO TABS: ORAL_TABLET | ORAL | 0 refills | Status: DC

## 2016-11-05 NOTE — Patient Instructions (Addendum)
IF you received an x-ray today, you will receive an invoice from [facility]. Please contact [facility] at [phone] with questions or concerns regarding your invoice.   IF you received labwork today, you will receive an invoice from [facility]. Please contact [facility] at [phone] with questions or concerns regarding your invoice.   Our billing staff will not be able to assist you with questions regarding bills from these companies.  You will be contacted with the lab results as soon as they are available. The fastest way to get your results is to activate your My Chart account. Instructions are located on the last page of this paperwork. If you have not heard from [place] regarding the results in 2 weeks, please contact this office.     Panic Attacks Panic attacks are sudden, short-livedsurges of severe anxiety, fear, or discomfort. They may occur for no reason when you are relaxed, when you are anxious, or when you are sleeping. Panic attacks may occur for a number of reasons:  Healthy people occasionally have panic attacks in extreme, life-threatening situations, such as war or natural disasters. Normal anxiety is a protective mechanism of the body that helps [place] react to danger (fight or flight response).  Panic attacks are often seen with anxiety disorders, such as panic disorder, social anxiety disorder, generalized anxiety disorder, and phobias. Anxiety disorders cause excessive or uncontrollable anxiety. They may interfere with your relationships or other life activities.  Panic attacks are sometimes seen with other mental illnesses, such as depression and posttraumatic stress disorder.  Certain medical conditions, prescription medicines, and drugs of abuse can cause panic attacks. What are the signs or symptoms? Panic attacks start suddenly, peak within 20 minutes, and are accompanied by four or more of the following symptoms:  Pounding heart or fast heart  rate (palpitations).  Sweating.  Trembling or shaking.  Shortness of breath or feeling smothered.  Feeling choked.  Chest pain or discomfort.  Nausea or strange feeling in your stomach.  Dizziness, light-headedness, or feeling like you will faint.  Chills or hot flushes.  Numbness or tingling in your lips or hands and feet.  Feeling that things are not real or feeling that you are not yourself.  Fear of losing control or going crazy.  Fear of dying. Some of these symptoms can mimic serious medical conditions. For example, you may think you are having a heart attack. Although panic attacks can be very scary, they are not life threatening. How is this diagnosed? Panic attacks are diagnosed through an assessment by your health care provider. Your health care provider will ask questions about your symptoms, such as where and when they occurred. Your health care provider will also ask about your medical history and use of alcohol and drugs, including prescription medicines. Your health care provider may order blood tests or other studies to rule out a serious medical condition. Your health care provider may refer you to a mental health professional for further evaluation. How is this treated?  Most healthy people who have one or two panic attacks in an extreme, life-threatening situation will not require treatment.  The treatment for panic attacks associated with anxiety disorders or other mental illness typically involves counseling with a mental health professional, medicine, or a combination of both. Your health care provider will help determine what treatment is best for you.  Panic attacks due to physical illness usually go away with treatment of the illness. If prescription medicine is causing panic attacks,  talk with your health care provider about stopping the medicine, decreasing the dose, or substituting another medicine.  Panic attacks due to alcohol or drug abuse go away  with abstinence. Some adults need professional help in order to stop drinking or using drugs. Follow these instructions at home:  Take all medicines as directed by your health care provider.  Schedule and attend follow-up visits as directed by your health care provider. It is important to keep all your appointments. Contact a health care provider if:  You are not able to take your medicines as prescribed.  Your symptoms do not improve or get worse. Get help right away if:  You experience panic attack symptoms that are different than your usual symptoms.  You have serious thoughts about hurting yourself or others.  You are taking medicine for panic attacks and have a serious side effect. This information is not intended to replace advice given to you by your health care provider. Make sure you discuss any questions you have with your health care provider. Document Released: 09/22/2005 Document Revised: 02/28/2016 Document Reviewed: 05/06/2013 Elsevier Interactive Patient Education  2017 [employer].

## 2016-11-05 NOTE — Progress Notes (Signed)
Chief Complaint  Patient presents with  . Medication Refill    xanax, wellbutrin    HPI  Anxiety Pt reports that she has started her Wellbutrin. She reports that she is trying to go to sleep at a better time which helps her mood. She reports that she has been cutting back on her xanax. She states that she was previously taking it daily.  Now she takes it on the plane for panic attacks and certain stressful situations. She no longer takes it daily and now is cutting it to a half.   Morbid Obesity She has not been able to add in any exercise. She reports that she has been able to change her eating patterns as yet. She states that she going to bed early which cuts down on the late night snacking.  Wt Readings from Last 3 Encounters:  11/05/16 298 lb (135.2 kg)  09/10/16 (!) 302 lb (137 kg)  08/11/16 299 lb (135.6 kg)   Asthma She reports that her asthma is well controlled Her triggers are URI She states that she only uses her albuterol as needed and seems to only be seasonal She thinks her albuterol has expired and needs a refill  Past Medical History:  Diagnosis Date  . Arthritis     Current Outpatient Prescriptions  Medication Sig Dispense Refill  . albuterol (PROVENTIL HFA;VENTOLIN HFA) 108 (90 Base) MCG/ACT inhaler Inhale 2 puffs into the lungs every 6 (six) hours as needed for wheezing. 1 Inhaler 1  . ALPRAZolam (XANAX) 0.25 MG tablet TAKE 1 TO 1 AND 1/2 TABLETS BY MOUTH EVERY DAY AS NEEDED 45 tablet 0  . buPROPion (WELLBUTRIN) 75 MG tablet Take 1 tablet (75 mg total) by mouth 2 (two) times daily. 60 tablet 6  . cholecalciferol (VITAMIN D) 1000 UNITS tablet Take 2,000 Units by mouth daily. Reported on 02/08/2016    . hydrocortisone 2.5 % cream Apply topically 2 (two) times daily. 30 g 0  . loratadine (CLARITIN) 10 MG tablet Take 10 mg by mouth daily.    . naproxen (NAPROSYN) 500 MG tablet Take 1 tablet (500 mg total) by mouth 2 (two) times daily with a meal. 30 tablet 0    . triamcinolone ointment (KENALOG) 0.1 % Apply 1 application topically 2 (two) times daily. 30 g 0   No current facility-administered medications for this visit.     Allergies:  Allergies  Allergen Reactions  . Other Anaphylaxis    Pecans and nuts     Past Surgical History:  Procedure Laterality Date  . CESAREAN SECTION      Social History   Social History  . Marital status: Single    Spouse name: N/A  . Number of children: N/A  . Years of education: N/A   Social History Main Topics  . Smoking status: Never Smoker  . Smokeless tobacco: Never Used  . Alcohol use No  . Drug use: No  . Sexual activity: Not Currently   Other Topics Concern  . None   Social History Narrative  . None    Review of Systems  Constitutional: Positive for weight loss. Negative for chills and fever.  Respiratory: Negative for cough, shortness of breath and wheezing.   Cardiovascular: Negative for chest pain, palpitations and leg swelling.  Neurological: Negative for dizziness and headaches.    Objective: Vitals:   11/05/16 0850  BP: 137/87  Pulse: 80  Resp: 16  Temp: 98.3 F (36.8 C)  TempSrc: Oral  SpO2: 99%  Weight: 298 lb (135.2 kg)  Height: 5' 9.5" (1.765 m)  Body mass index is 43.38 kg/m.   Physical Exam  Constitutional: She is oriented to person, place, and time. She appears well-developed and well-nourished.  HENT:  Head: Normocephalic and atraumatic.  Right Ear: External ear normal.  Left Ear: External ear normal.  Eyes: Conjunctivae and EOM are normal.  Neck: Normal range of motion. No thyromegaly present.  Cardiovascular: Normal rate, regular rhythm and normal heart sounds.   No murmur heard. Pulses:      Dorsalis pedis pulses are 2+ on the right side, and 2+ on the left side.       Posterior tibial pulses are 2+ on the right side, and 2+ on the left side.  Pulmonary/Chest: Effort normal and breath sounds normal. No respiratory distress. She has no wheezes.   Musculoskeletal: Normal range of motion.       Right ankle: Normal. She exhibits normal range of motion and no swelling. No tenderness.       Feet:  Neurological: She is alert and oriented to person, place, and time.  Psychiatric: She has a normal mood and affect. Her behavior is normal. Judgment and thought content normal.    Assessment and Plan Catalena was seen today for medication refill.  Diagnoses and all orders for this visit:   Asthma, chronic, mild intermittent, uncomplicated- refilled albuterol today -     albuterol (PROVENTIL HFA;VENTOLIN HFA) 108 (90 Base) MCG/ACT inhaler; Inhale 2 puffs into the lungs every 6 (six) hours as needed for wheezing.  Generalized anxiety disorder- refilled medications today -     buPROPion (WELLBUTRIN) 75 MG tablet; Take 1 tablet (75 mg total) by mouth 2 (two) times daily.  Morbid obesity ([place])- improved with lifestyle modification  Other orders -     ALPRAZolam (XANAX) 0.25 MG tablet; TAKE 1 TO 1 AND 1/2 TABLETS BY MOUTH EVERY DAY AS NEEDED  Left foot pain and plantar fasciitis - advised motrin Also has symptoms of plantar fasciitis Discussed stretching and motrin prn and weight loss   Ethon Wymer A [place]

## 2016-12-24 ENCOUNTER — Ambulatory Visit: Payer: BC Managed Care – PPO | Admitting: Family Medicine

## 2016-12-31 ENCOUNTER — Encounter: Payer: Self-pay | Admitting: Family Medicine

## 2016-12-31 ENCOUNTER — Ambulatory Visit (INDEPENDENT_AMBULATORY_CARE_PROVIDER_SITE_OTHER): Payer: BC Managed Care – PPO | Admitting: Family Medicine

## 2016-12-31 VITALS — BP 133/83 | HR 79 | Temp 98.1°F | Resp 18 | Ht 69.5 in | Wt 292.6 lb

## 2016-12-31 DIAGNOSIS — R3129 Other microscopic hematuria: Secondary | ICD-10-CM | POA: Diagnosis not present

## 2016-12-31 DIAGNOSIS — Z124 Encounter for screening for malignant neoplasm of cervix: Secondary | ICD-10-CM

## 2016-12-31 DIAGNOSIS — Z113 Encounter for screening for infections with a predominantly sexual mode of transmission: Secondary | ICD-10-CM | POA: Diagnosis not present

## 2016-12-31 DIAGNOSIS — J301 Allergic rhinitis due to pollen: Secondary | ICD-10-CM

## 2016-12-31 DIAGNOSIS — Z0000 Encounter for general adult medical examination without abnormal findings: Secondary | ICD-10-CM

## 2016-12-31 DIAGNOSIS — F4322 Adjustment disorder with anxiety: Secondary | ICD-10-CM

## 2016-12-31 MED ORDER — BUPROPION HCL ER (XL) 150 MG PO TB24: 150.0000 mg | ORAL_TABLET | Freq: Every day | ORAL | 3 refills | Status: DC

## 2016-12-31 MED ORDER — FLUTICASONE PROPIONATE 50 MCG/ACT NA SUSP: 2.0000 | Freq: Every day | NASAL | 6 refills | Status: DC

## 2016-12-31 NOTE — Progress Notes (Signed)
Chief Complaint  Patient presents with  . Annual Exam    with pap    Subjective:  Victoria Ewing is a 41 y.o. female here for a health maintenance visit.  Patient is established pt  Patient Active Problem List   Diagnosis Date Noted  . Eczema 12/14/2014  . Asthma, chronic 08/30/2014  . Anxiety state 07/22/2013  . Allergic rhinitis 07/22/2013    Past Medical History:  Diagnosis Date  . Arthritis     Past Surgical History:  Procedure Laterality Date  . CESAREAN SECTION       Outpatient Medications Prior to Visit  Medication Sig Dispense Refill  . albuterol (PROVENTIL HFA;VENTOLIN HFA) 108 (90 Base) MCG/ACT inhaler Inhale 2 puffs into the lungs every 6 (six) hours as needed for wheezing. 1 Inhaler 1  . ALPRAZolam (XANAX) 0.25 MG tablet TAKE 1 TO 1 AND 1/2 TABLETS BY MOUTH EVERY DAY AS NEEDED 45 tablet 0  . buPROPion (WELLBUTRIN) 75 MG tablet Take 1 tablet (75 mg total) by mouth 2 (two) times daily. 60 tablet 6  . cholecalciferol (VITAMIN D) 1000 UNITS tablet Take 2,000 Units by mouth daily. Reported on 02/08/2016    . hydrocortisone 2.5 % cream Apply topically 2 (two) times daily. 30 g 0  . loratadine (CLARITIN) 10 MG tablet Take 10 mg by mouth daily.    . naproxen (NAPROSYN) 500 MG tablet Take 1 tablet (500 mg total) by mouth 2 (two) times daily with a meal. 30 tablet 0  . triamcinolone ointment (KENALOG) 0.1 % Apply 1 application topically 2 (two) times daily. 30 g 0   No facility-administered medications prior to visit.     Allergies  Allergen Reactions  . Other Anaphylaxis    Pecans and nuts      Family History  Problem Relation Age of Onset  . Cancer Mother      Health Habits: Dental Exam: not up to date Eye Exam: not up to date Exercise: 3 times/week on average Current exercise activities: walking/running Diet: balanced  Social History   Social History  . Marital status: Single    Spouse name: N/A  . Number of children: N/A  . Years of  education: N/A   Occupational History  . Not on file.   Social History Main Topics  . Smoking status: Never Smoker  . Smokeless tobacco: Never Used  . Alcohol use No  . Drug use: No  . Sexual activity: Not Currently   Other Topics Concern  . Not on file   Social History Narrative  . No narrative on file   History  Alcohol Use No   History  Smoking Status  . Never Smoker  Smokeless Tobacco  . Never Used   History  Drug Use No    GYN: Sexual Health Menstrual status: regular menses LMP: Patient's last menstrual period was 12/13/2016. Last pap smear: see HM section History of abnormal pap smears:  Sexually active: with female partner but practices abstinence currently Current contraception:   Health Maintenance: See under health Maintenance activity for review of completion dates as well. Immunization History  Administered Date(s) Administered  . PPD Test 05/30/2013  . Tdap 10/17/2013      Depression Screen-PHQ2/9 Depression screen [facility] 2/9 12/31/2016 11/05/2016 09/10/2016 08/11/2016 02/08/2016  Decreased Interest 0 0 0 2 0  Down, Depressed, Hopeless 0 0 0 3 0  PHQ - 2 Score 0 0 0 5 0  Altered sleeping - - - 2 -  Tired, decreased  energy - - - 3 -  Change in appetite - - - 2 -  Feeling bad or failure about yourself  - - - 2 -  Trouble concentrating - - - 0 -  Moving slowly or fidgety/restless - - - 1 -  Suicidal thoughts - - - 0 -  PHQ-9 Score - - - 15 -       Depression Severity and Treatment Recommendations:  0-4= None  5-9= Mild / Treatment: Support, educate to call if worse; return in one month  10-14= Moderate / Treatment: Support, watchful waiting; Antidepressant or Psycotherapy  15-19= Moderately severe / Treatment: Antidepressant OR Psychotherapy  >= 20 = Major depression, severe / Antidepressant AND Psychotherapy    Review of Systems   Review of Systems  HENT: Positive for congestion, sinus pain and sore throat.   Eyes: Positive for redness.        Itchy eyes  Musculoskeletal: Positive for myalgias.  Neurological: Positive for dizziness and headaches.  Endo/Heme/Allergies: Positive for environmental allergies.  Psychiatric/Behavioral: The patient is nervous/anxious and has insomnia.     See HPI for ROS as well.    Objective:   Vitals:   12/31/16 0858  BP: 133/83  Pulse: 79  Resp: 18  Temp: 98.1 F (36.7 C)  TempSrc: Oral  SpO2: 97%  Weight: 292 lb 9.6 oz (132.7 kg)  Height: 5' 9.5" (1.765 m)   Wt Readings from Last 3 Encounters:  12/31/16 292 lb 9.6 oz (132.7 kg)  11/05/16 298 lb (135.2 kg)  09/10/16 (!) 302 lb (137 kg)    Body mass index is 42.59 kg/m.  Physical Exam  Constitutional: She appears well-developed and well-nourished.  HENT:  Head: Normocephalic and atraumatic.  Right Ear: External ear normal.  Left Ear: External ear normal.  Nose: Nose normal.  Mouth/Throat: Oropharynx is clear and moist.  Eyes: Conjunctivae and EOM are normal. Right eye exhibits no discharge. Left eye exhibits no discharge.  Neck: Normal range of motion. Neck supple. No thyromegaly present.  Cardiovascular: Normal rate, regular rhythm, normal heart sounds and intact distal pulses.   No murmur heard. Pulmonary/Chest: Effort normal and breath sounds normal. No respiratory distress. She has no wheezes. She has no rales. She exhibits no tenderness.  Abdominal: Soft. Bowel sounds are normal. She exhibits no distension. There is no tenderness. There is no rebound and no guarding.  Musculoskeletal: Normal range of motion.  Skin: Skin is warm.  Psychiatric: She has a normal mood and affect. Her behavior is normal. Judgment and thought content normal.       Assessment/Plan:   Patient was seen for a health maintenance exam.  Counseled the patient on health maintenance issues. Reviewed her health mainteance schedule and ordered appropriate tests (see orders.) Counseled on regular exercise and weight management. Recommend  regular eye exams and dental cleaning.   The following issues were addressed today for health maintenance:   Hugh was seen today for annual exam.  Diagnoses and all orders for this visit:  Health maintenance examination- age appropriate screenings resolved  Screen for STD (sexually transmitted disease)- reviewed std screenings -     Pap IG, CT/NG NAA, and HPV (high risk) -     HIV antibody -     Hepatitis B surface antigen -     RPR -     HSV(herpes simplex vrs) 1+2 ab-IgG  Screening for cervical cancer- pap smear guidelines reviewed -     Pap IG, CT/NG NAA, and HPV (  high risk)  Chronic seasonal allergic rhinitis due to pollen -     fluticasone (FLONASE) 50 MCG/ACT nasal spray; Place 2 sprays into both nostrils daily.   Adjustment disorder with anxious mood-  Advised pt to follow up with CBT Discussed changing perspectives about work  Microscopic hematuria- pt did not follow up with urology Will recheck  Discussed that this does not remove the need for Urology follow up    No Follow-up on file.    Body mass index is 42.59 kg/m.:  Discussed the patient's BMI with patient. The BMI body mass index is 42.59 kg/m.     Future Appointments Date Time Provider [place]  01/07/2017 8:20 AM Forrest Moron, MD PCP-PCP [facility]    Patient Instructions       IF you received an x-ray today, you will receive an invoice from [facility]. Please contact [facility] at [phone] with questions or concerns regarding your invoice.   IF you received labwork today, you will receive an invoice from [place]. Please contact [facility] at [phone] with questions or concerns regarding your invoice.   Our billing staff will not be able to assist you with questions regarding bills from these companies.  You will be contacted with the lab results as soon as they are available. The fastest way to get your results is to activate your My Chart account.  Instructions are located on the last page of this paperwork. If you have not heard from [place] regarding the results in 2 weeks, please contact this office.     Health Maintenance, Female Adopting a healthy lifestyle and getting preventive care can go a long way to promote health and wellness. Talk with your health care provider about what schedule of regular examinations is right for you. This is a good chance for you to check in with your provider about disease prevention and staying healthy. In between checkups, there are plenty of things you can do on your own. Experts have done a lot of research about which lifestyle changes and preventive measures are most likely to keep you healthy. Ask your health care provider for more information. Weight and diet Eat a healthy diet  Be sure to include plenty of vegetables, fruits, low-fat dairy products, and lean protein.  Do not eat a lot of foods high in solid fats, added sugars, or salt.  Get regular exercise. This is one of the most important things you can do for your health.  Most adults should exercise for at least 150 minutes each week. The exercise should increase your heart rate and make you sweat (moderate-intensity exercise).  Most adults should also do strengthening exercises at least twice a week. This is in addition to the moderate-intensity exercise. Maintain a healthy weight  Body mass index (BMI) is a measurement that can be used to identify possible weight problems. It estimates body fat based on height and weight. Your health care provider can help determine your BMI and help you achieve or maintain a healthy weight.  For females 22 years of age and older:  A BMI below 18.5 is considered underweight.  A BMI of 18.5 to 24.9 is normal.  A BMI of 25 to 29.9 is considered overweight.  A BMI of 30 and above is considered obese. Watch levels of cholesterol and blood lipids  You should start having your blood tested for lipids and  cholesterol at 39 years of age, then have this test every 5 years.  You may need  to have your cholesterol levels checked more often if:  Your lipid or cholesterol levels are high.  You are older than 39 years of age.  You are at high risk for heart disease. Cancer screening Lung Cancer  Lung cancer screening is recommended for adults 21-105 years old who are at high risk for lung cancer because of a history of smoking.  A yearly low-dose CT scan of the lungs is recommended for people who:  Currently smoke.  Have quit within the past 15 years.  Have at least a 30-pack-year history of smoking. A pack year is smoking an average of one pack of cigarettes a day for 1 year.  Yearly screening should continue until it has been 15 years since you quit.  Yearly screening should stop if you develop a health problem that would prevent you from having lung cancer treatment. Breast Cancer  Practice breast self-awareness. This means understanding how your breasts normally appear and feel.  It also means doing regular breast self-exams. Let your health care provider know about any changes, no matter how small.  If you are in your 20s or 30s, you should have a clinical breast exam (CBE) by a health care provider every 1-3 years as part of a regular health exam.  If you are 20 or older, have a CBE every year. Also consider having a breast X-ray (mammogram) every year.  If you have a family history of breast cancer, talk to your health care provider about genetic screening.  If you are at high risk for breast cancer, talk to your health care provider about having an MRI and a mammogram every year.  Breast cancer gene (BRCA) assessment is recommended for women who have family members with BRCA-related cancers. BRCA-related cancers include:  Breast.  Ovarian.  Tubal.  Peritoneal cancers.  Results of the assessment will determine the need for genetic counseling and BRCA1 and BRCA2  testing. Cervical Cancer  Your health care provider may recommend that you be screened regularly for cancer of the pelvic organs (ovaries, uterus, and vagina). This screening involves a pelvic examination, including checking for microscopic changes to the surface of your cervix (Pap test). You may be encouraged to have this screening done every 3 years, beginning at age 4.  For women ages 59-65, health care providers may recommend pelvic exams and Pap testing every 3 years, or they may recommend the Pap and pelvic exam, combined with testing for human papilloma virus (HPV), every 5 years. Some types of HPV increase your risk of cervical cancer. Testing for HPV may also be done on women of any age with unclear Pap test results.  Other health care providers may not recommend any screening for nonpregnant women who are considered low risk for pelvic cancer and who do not have symptoms. Ask your health care provider if a screening pelvic exam is right for you.  If you have had past treatment for cervical cancer or a condition that could lead to cancer, you need Pap tests and screening for cancer for at least 20 years after your treatment. If Pap tests have been discontinued, your risk factors (such as having a new sexual partner) need to be reassessed to determine if screening should resume. Some women have medical problems that increase the chance of getting cervical cancer. In these cases, your health care provider may recommend more frequent screening and Pap tests. Colorectal Cancer  This type of cancer can be detected and often prevented.  Routine colorectal cancer  screening usually begins at 39 years of age and continues through 39 years of age.  Your health care provider may recommend screening at an earlier age if you have risk factors for colon cancer.  Your health care provider may also recommend using home test kits to check for hidden blood in the stool.  A small camera at the end of a  tube can be used to examine your colon directly (sigmoidoscopy or colonoscopy). This is done to check for the earliest forms of colorectal cancer.  Routine screening usually begins at age 36.  Direct examination of the colon should be repeated every 5-10 years through 39 years of age. However, you may need to be screened more often if early forms of precancerous polyps or small growths are found. Skin Cancer  Check your skin from head to toe regularly.  Tell your health care provider about any new moles or changes in moles, especially if there is a change in a mole's shape or color.  Also tell your health care provider if you have a mole that is larger than the size of a pencil eraser.  Always use sunscreen. Apply sunscreen liberally and repeatedly throughout the day.  Protect yourself by wearing long sleeves, pants, a wide-brimmed hat, and sunglasses whenever you are outside. Heart disease, diabetes, and high blood pressure  High blood pressure causes heart disease and increases the risk of stroke. High blood pressure is more likely to develop in:  People who have blood pressure in the high end of the normal range (130-139/85-89 mm Hg).  People who are overweight or obese.  People who are African American.  If you are 46-36 years of age, have your blood pressure checked every 3-5 years. If you are 29 years of age or older, have your blood pressure checked every year. You should have your blood pressure measured twice-once when you are at a hospital or clinic, and once when you are not at a hospital or clinic. Record the average of the two measurements. To check your blood pressure when you are not at a hospital or clinic, you can use:  An automated blood pressure machine at a pharmacy.  A home blood pressure monitor.  If you are between 63 years and 47 years old, ask your health care provider if you should take aspirin to prevent strokes.  Have regular diabetes screenings. This  involves taking a blood sample to check your fasting blood sugar level.  If you are at a normal weight and have a low risk for diabetes, have this test once every three years after 39 years of age.  If you are overweight and have a high risk for diabetes, consider being tested at a younger age or more often. Preventing infection Hepatitis B  If you have a higher risk for hepatitis B, you should be screened for this virus. You are considered at high risk for hepatitis B if:  You were born in a country where hepatitis B is common. Ask your health care provider which countries are considered high risk.  Your parents were born in a high-risk country, and you have not been immunized against hepatitis B (hepatitis B vaccine).  You have HIV or AIDS.  You use needles to inject street drugs.  You live with someone who has hepatitis B.  You have had sex with someone who has hepatitis B.  You get hemodialysis treatment.  You take certain medicines for conditions, including cancer, organ transplantation, and autoimmune conditions. Hepatitis  C  Blood testing is recommended for:  Everyone born from [birth year] through 1963.  Anyone with known risk factors for hepatitis C. Sexually transmitted infections (STIs)  You should be screened for sexually transmitted infections (STIs) including gonorrhea and chlamydia if:  You are sexually active and are younger than 39 years of age.  You are older than 39 years of age and your health care provider tells you that you are at risk for this type of infection.  Your sexual activity has changed since you were last screened and you are at an increased risk for chlamydia or gonorrhea. Ask your health care provider if you are at risk.  If you do not have HIV, but are at risk, it may be recommended that you take a prescription medicine daily to prevent HIV infection. This is called pre-exposure prophylaxis (PrEP). You are considered at risk if:  You are  sexually active and do not regularly use condoms or know the HIV status of your partner(s).  You take drugs by injection.  You are sexually active with a partner who has HIV. Talk with your health care provider about whether you are at high risk of being infected with HIV. If you choose to begin PrEP, you should first be tested for HIV. You should then be tested every 3 months for as long as you are taking PrEP. Pregnancy  If you are premenopausal and you may become pregnant, ask your health care provider about preconception counseling.  If you may become pregnant, take 400 to 800 micrograms (mcg) of folic acid every day.  If you want to prevent pregnancy, talk to your health care provider about birth control (contraception). Osteoporosis and menopause  Osteoporosis is a disease in which the bones lose minerals and strength with aging. This can result in serious bone fractures. Your risk for osteoporosis can be identified using a bone density scan.  If you are 60 years of age or older, or if you are at risk for osteoporosis and fractures, ask your health care provider if you should be screened.  Ask your health care provider whether you should take a calcium or vitamin D supplement to lower your risk for osteoporosis.  Menopause may have certain physical symptoms and risks.  Hormone replacement therapy may reduce some of these symptoms and risks. Talk to your health care provider about whether hormone replacement therapy is right for you. Follow these instructions at home:  Schedule regular health, dental, and eye exams.  Stay current with your immunizations.  Do not use any tobacco products including cigarettes, chewing tobacco, or electronic cigarettes.  If you are pregnant, do not drink alcohol.  If you are breastfeeding, limit how much and how often you drink alcohol.  Limit alcohol intake to no more than 1 drink per day for nonpregnant women. One drink equals 12 ounces of  beer, 5 ounces of wine, or 1 ounces of hard liquor.  Do not use street drugs.  Do not share needles.  Ask your health care provider for help if you need support or information about quitting drugs.  Tell your health care provider if you often feel depressed.  Tell your health care provider if you have ever been abused or do not feel safe at home. This information is not intended to replace advice given to you by your health care provider. Make sure you discuss any questions you have with your health care provider. Document Released: 04/07/2011 Document Revised: 02/28/2016 Document Reviewed: 06/26/2015 Elsevier Interactive  Patient Education  2017 [employer].

## 2016-12-31 NOTE — Patient Instructions (Addendum)
IF you received an x-ray today, you will receive an invoice from [facility]: [facility]. Please contact [facility] at [phone] with questions or concerns regarding your invoice.   IF you received labwork today, you will receive an invoice from [facility]. Please contact [facility] at [phone] with questions or concerns regarding your invoice.   Our billing staff will not be able to assist you with questions regarding bills from these companies.  You will be contacted with the lab results as soon as they are available. The fastest way to get your results is to activate your My Chart account. Instructions are located on the last page of this paperwork. If you have not heard from [place] regarding the results in 2 weeks, please contact this office.     Health Maintenance, Female Adopting a healthy lifestyle and getting preventive care can go a long way to promote health and wellness. Talk with your health care provider about what schedule of regular examinations is right for you. This is a good chance for you to check in with your provider about disease prevention and staying healthy. In between checkups, there are plenty of things you can do on your own. Experts have done a lot of research about which lifestyle changes and preventive measures are most likely to keep you healthy. Ask your health care provider for more information. Weight and diet Eat a healthy diet  Be sure to include plenty of vegetables, fruits, low-fat dairy products, and lean protein.  Do not eat a lot of foods high in solid fats, added sugars, or salt.  Get regular exercise. This is one of the most important things you can do for your health.  Most adults should exercise for at least 150 minutes each week. The exercise should increase your heart rate and make you sweat (moderate-intensity exercise).  Most adults should also do strengthening exercises at least twice a week. This is in addition to the  moderate-intensity exercise. Maintain a healthy weight  Body mass index (BMI) is a measurement that can be used to identify possible weight problems. It estimates body fat based on height and weight. Your health care provider can help determine your BMI and help you achieve or maintain a healthy weight.  For females 93 years of age and older:  A BMI below 18.5 is considered underweight.  A BMI of 18.5 to 24.9 is normal.  A BMI of 25 to 29.9 is considered overweight.  A BMI of 30 and above is considered obese. Watch levels of cholesterol and blood lipids  You should start having your blood tested for lipids and cholesterol at 39 years of age, then have this test every 5 years.  You may need to have your cholesterol levels checked more often if:  Your lipid or cholesterol levels are high.  You are older than 39 years of age.  You are at high risk for heart disease. Cancer screening Lung Cancer  Lung cancer screening is recommended for adults 100-64 years old who are at high risk for lung cancer because of a history of smoking.  A yearly low-dose CT scan of the lungs is recommended for people who:  Currently smoke.  Have quit within the past 15 years.  Have at least a 30-pack-year history of smoking. A pack year is smoking an average of one pack of cigarettes a day for 1 year.  Yearly screening should continue until it has been 15 years since you quit.  Yearly screening should stop  if you develop a health problem that would prevent you from having lung cancer treatment. Breast Cancer  Practice breast self-awareness. This means understanding how your breasts normally appear and feel.  It also means doing regular breast self-exams. Let your health care provider know about any changes, no matter how small.  If you are in your 20s or 30s, you should have a clinical breast exam (CBE) by a health care provider every 1-3 years as part of a regular health exam.  If you are 79 or  older, have a CBE every year. Also consider having a breast X-ray (mammogram) every year.  If you have a family history of breast cancer, talk to your health care provider about genetic screening.  If you are at high risk for breast cancer, talk to your health care provider about having an MRI and a mammogram every year.  Breast cancer gene (BRCA) assessment is recommended for women who have family members with BRCA-related cancers. BRCA-related cancers include:  Breast.  Ovarian.  Tubal.  Peritoneal cancers.  Results of the assessment will determine the need for genetic counseling and BRCA1 and BRCA2 testing. Cervical Cancer  Your health care provider may recommend that you be screened regularly for cancer of the pelvic organs (ovaries, uterus, and vagina). This screening involves a pelvic examination, including checking for microscopic changes to the surface of your cervix (Pap test). You may be encouraged to have this screening done every 3 years, beginning at age 64.  For women ages 74-65, health care providers may recommend pelvic exams and Pap testing every 3 years, or they may recommend the Pap and pelvic exam, combined with testing for human papilloma virus (HPV), every 5 years. Some types of HPV increase your risk of cervical cancer. Testing for HPV may also be done on women of any age with unclear Pap test results.  Other health care providers may not recommend any screening for nonpregnant women who are considered low risk for pelvic cancer and who do not have symptoms. Ask your health care provider if a screening pelvic exam is right for you.  If you have had past treatment for cervical cancer or a condition that could lead to cancer, you need Pap tests and screening for cancer for at least 20 years after your treatment. If Pap tests have been discontinued, your risk factors (such as having a new sexual partner) need to be reassessed to determine if screening should resume. Some  women have medical problems that increase the chance of getting cervical cancer. In these cases, your health care provider may recommend more frequent screening and Pap tests. Colorectal Cancer  This type of cancer can be detected and often prevented.  Routine colorectal cancer screening usually begins at 39 years of age and continues through 40 years of age.  Your health care provider may recommend screening at an earlier age if you have risk factors for colon cancer.  Your health care provider may also recommend using home test kits to check for hidden blood in the stool.  A small camera at the end of a tube can be used to examine your colon directly (sigmoidoscopy or colonoscopy). This is done to check for the earliest forms of colorectal cancer.  Routine screening usually begins at age 45.  Direct examination of the colon should be repeated every 5-10 years through 39 years of age. However, you may need to be screened more often if early forms of precancerous polyps or small growths are found.  Skin Cancer  Check your skin from head to toe regularly.  Tell your health care provider about any new moles or changes in moles, especially if there is a change in a mole's shape or color.  Also tell your health care provider if you have a mole that is larger than the size of a pencil eraser.  Always use sunscreen. Apply sunscreen liberally and repeatedly throughout the day.  Protect yourself by wearing long sleeves, pants, a wide-brimmed hat, and sunglasses whenever you are outside. Heart disease, diabetes, and high blood pressure  High blood pressure causes heart disease and increases the risk of stroke. High blood pressure is more likely to develop in:  People who have blood pressure in the high end of the normal range (130-139/85-89 mm Hg).  People who are overweight or obese.  People who are African American.  If you are 18-39 years of age, have your blood pressure checked every  3-5 years. If you are 40 years of age or older, have your blood pressure checked every year. You should have your blood pressure measured twice-once when you are at a hospital or clinic, and once when you are not at a hospital or clinic. Record the average of the two measurements. To check your blood pressure when you are not at a hospital or clinic, you can use:  An automated blood pressure machine at a pharmacy.  A home blood pressure monitor.  If you are between 55 years and 79 years old, ask your health care provider if you should take aspirin to prevent strokes.  Have regular diabetes screenings. This involves taking a blood sample to check your fasting blood sugar level.  If you are at a normal weight and have a low risk for diabetes, have this test once every three years after 39 years of age.  If you are overweight and have a high risk for diabetes, consider being tested at a younger age or more often. Preventing infection Hepatitis B  If you have a higher risk for hepatitis B, you should be screened for this virus. You are considered at high risk for hepatitis B if:  You were born in a country where hepatitis B is common. Ask your health care provider which countries are considered high risk.  Your parents were born in a high-risk country, and you have not been immunized against hepatitis B (hepatitis B vaccine).  You have HIV or AIDS.  You use needles to inject street drugs.  You live with someone who has hepatitis B.  You have had sex with someone who has hepatitis B.  You get hemodialysis treatment.  You take certain medicines for conditions, including cancer, organ transplantation, and autoimmune conditions. Hepatitis C  Blood testing is recommended for:  Everyone born from 1945 through 1965.  Anyone with known risk factors for hepatitis C. Sexually transmitted infections (STIs)  You should be screened for sexually transmitted infections (STIs) including  gonorrhea and chlamydia if:  You are sexually active and are younger than 39 years of age.  You are older than 39 years of age and your health care provider tells you that you are at risk for this type of infection.  Your sexual activity has changed since you were last screened and you are at an increased risk for chlamydia or gonorrhea. Ask your health care provider if you are at risk.  If you do not have HIV, but are at risk, it may be recommended that you take a prescription   medicine daily to prevent HIV infection. This is called pre-exposure prophylaxis (PrEP). You are considered at risk if:  You are sexually active and do not regularly use condoms or know the HIV status of your partner(s).  You take drugs by injection.  You are sexually active with a partner who has HIV. Talk with your health care provider about whether you are at high risk of being infected with HIV. If you choose to begin PrEP, you should first be tested for HIV. You should then be tested every 3 months for as long as you are taking PrEP. Pregnancy  If you are premenopausal and you may become pregnant, ask your health care provider about preconception counseling.  If you may become pregnant, take 400 to 800 micrograms (mcg) of folic acid every day.  If you want to prevent pregnancy, talk to your health care provider about birth control (contraception). Osteoporosis and menopause  Osteoporosis is a disease in which the bones lose minerals and strength with aging. This can result in serious bone fractures. Your risk for osteoporosis can be identified using a bone density scan.  If you are 72 years of age or older, or if you are at risk for osteoporosis and fractures, ask your health care provider if you should be screened.  Ask your health care provider whether you should take a calcium or vitamin D supplement to lower your risk for osteoporosis.  Menopause may have certain physical symptoms and risks.  Hormone  replacement therapy may reduce some of these symptoms and risks. Talk to your health care provider about whether hormone replacement therapy is right for you. Follow these instructions at home:  Schedule regular health, dental, and eye exams.  Stay current with your immunizations.  Do not use any tobacco products including cigarettes, chewing tobacco, or electronic cigarettes.  If you are pregnant, do not drink alcohol.  If you are breastfeeding, limit how much and how often you drink alcohol.  Limit alcohol intake to no more than 1 drink per day for nonpregnant women. One drink equals 12 ounces of beer, 5 ounces of wine, or 1 ounces of hard liquor.  Do not use street drugs.  Do not share needles.  Ask your health care provider for help if you need support or information about quitting drugs.  Tell your health care provider if you often feel depressed.  Tell your health care provider if you have ever been abused or do not feel safe at home. This information is not intended to replace advice given to you by your health care provider. Make sure you discuss any questions you have with your health care provider. Document Released: 04/07/2011 Document Revised: 02/28/2016 Document Reviewed: 06/26/2015 Elsevier Interactive Patient Education  2017 [employer].

## 2017-01-01 LAB — URINALYSIS, MICROSCOPIC ONLY: Casts: NONE SEEN /lpf

## 2017-01-01 LAB — RPR: RPR: NONREACTIVE

## 2017-01-01 LAB — HSV(HERPES SIMPLEX VRS) I + II AB-IGG
HSV 1 Glycoprotein G Ab, IgG: <0.91 index (ref 0.00–0.90)
HSV 2 GLYCOPROTEIN G AB, IGG: 6.97 {index} — AB (ref 0.00–0.90)

## 2017-01-01 LAB — HEPATITIS B SURFACE ANTIGEN: HEP B S AG: NEGATIVE

## 2017-01-01 LAB — HIV ANTIBODY (ROUTINE TESTING W REFLEX): HIV SCREEN 4TH GENERATION: NONREACTIVE

## 2017-01-02 LAB — PAP IG, CT-NG NAA, HPV HIGH-RISK
Chlamydia, Nuc. Acid Amp: NEGATIVE
GONOCOCCUS BY NUCLEIC ACID AMP: NEGATIVE
HPV, HIGH-RISK: NEGATIVE
PAP SMEAR COMMENT: 0

## 2017-01-07 ENCOUNTER — Encounter: Payer: BC Managed Care – PPO | Admitting: Family Medicine

## 2017-02-03 ENCOUNTER — Other Ambulatory Visit: Payer: Self-pay | Admitting: Family Medicine

## 2017-02-03 NOTE — Telephone Encounter (Signed)
Called to [facility]. 

## 2017-02-24 ENCOUNTER — Ambulatory Visit (INDEPENDENT_AMBULATORY_CARE_PROVIDER_SITE_OTHER): Payer: BC Managed Care – PPO | Admitting: Family Medicine

## 2017-02-24 ENCOUNTER — Encounter: Payer: Self-pay | Admitting: Family Medicine

## 2017-02-24 VITALS — BP 154/86 | HR 94 | Temp 98.2°F | Resp 20 | Ht 70.35 in | Wt 294.6 lb

## 2017-02-24 DIAGNOSIS — F4322 Adjustment disorder with anxiety: Secondary | ICD-10-CM

## 2017-02-24 DIAGNOSIS — G44209 Tension-type headache, unspecified, not intractable: Secondary | ICD-10-CM | POA: Diagnosis not present

## 2017-02-24 MED ORDER — NAPROXEN 500 MG PO TABS: 500.0000 mg | ORAL_TABLET | Freq: Two times a day (BID) | ORAL | 6 refills | Status: DC

## 2017-02-24 NOTE — Progress Notes (Signed)
Chief Complaint  Patient presents with  . 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  She states that she felt like this exacerbated her panic disorder.  She states that she feels overwhelmed and unsupported Due to lack of extra money she could not afford Psychiatry She denies SI   She reports that she gets daily headaches Every day she wakes up or develops a headache They are either band like compression across her forehead She states that she also has headaches at times over one eye She states that it might be some of her sinus     Past Medical History:  Diagnosis Date  . Allergy   . Anxiety   . Asthma   . Depression     Current Outpatient Prescriptions  Medication Sig Dispense Refill  . albuterol (PROVENTIL HFA;VENTOLIN HFA) 108 (90 Base) MCG/ACT inhaler Inhale 2 puffs into the lungs every 6 (six) hours as needed for wheezing. 1 Inhaler 1  . ALPRAZolam (XANAX) 0.25 MG tablet TAKE 1 TO 1 & 1/2 TABLETS ONCE DAILY AS NEEDED 45 tablet 0  . buPROPion (WELLBUTRIN XL) 150 MG 24 hr tablet Take 1 tablet (150 mg total) by mouth daily. 30 tablet 3  . cholecalciferol (VITAMIN D) 1000 UNITS tablet Take 2,000 Units by mouth daily. Reported on 02/08/2016    . fluticasone (FLONASE) 50 MCG/ACT nasal spray Place 2 sprays into both nostrils daily. 16 g 6  . hydrocortisone 2.5 % cream Apply topically 2 (two) times daily. 30 g 0  . loratadine (CLARITIN) 10 MG tablet Take 10 mg by mouth daily.    . naproxen (NAPROSYN) 500 MG tablet Take 1 tablet (500 mg total) by mouth 2 (two) times daily with a meal. 30 tablet 6  . triamcinolone ointment (KENALOG) 0.1 % Apply 1 application topically 2 (two) times daily. 30 g 0  . buPROPion (WELLBUTRIN) 75 MG tablet Take 1 tablet (75 mg total) by mouth 2 (two) times daily. (Patient not taking: Reported on 02/24/2017)  60 tablet 6   No current facility-administered medications for this visit.     Allergies:  Allergies  Allergen Reactions  . Other Anaphylaxis    Pecans and nuts     Past Surgical History:  Procedure Laterality Date  . CESAREAN SECTION      Social History   Social History  . Marital status: Single    Spouse name: N/A  . Number of children: N/A  . Years of education: N/A   Social History Main Topics  . Smoking status: Never Smoker  . Smokeless tobacco: Never Used  . Alcohol use 0.0 oz/week  . Drug use: No  . Sexual activity: Not Currently   Other Topics Concern  . None   Social History Narrative  . None     Review of Systems  Constitutional: Negative for chills and fever.  Eyes: Negative for blurred vision and double vision.  Cardiovascular: Negative for chest pain and palpitations.  Gastrointestinal: Negative for nausea and vomiting.  Skin: Negative for itching and rash.  Neurological: Negative for dizziness and headaches.  Psychiatric/Behavioral: Negative for depression and hallucinations. The patient is not nervous/anxious.      Objective: Vitals:   02/24/17 1734 02/24/17 1803  BP: (!) 154/107 (!) 154/86  Pulse: 94   Resp: 20   Temp:  98.2 F (36.8 C)   TempSrc: Oral   SpO2: 98%   Weight: 294 lb 9.6 oz (133.6 kg)   Height: 5' 10.35" (1.787 m)    Wt Readings from Last 3 Encounters:  02/24/17 294 lb 9.6 oz (133.6 kg)  12/31/16 292 lb 9.6 oz (132.7 kg)  11/05/16 298 lb (135.2 kg)    Physical Exam  Constitutional: She is oriented to person, place, and time. She appears well-developed and well-nourished.  HENT:  Head: Normocephalic and atraumatic.  Right Ear: External ear normal.  Left Ear: External ear normal.  Nose: Nose normal.  Mouth/Throat: Oropharynx is clear and moist.  Eyes: Conjunctivae and EOM are normal.  Fundoscopic exam:      The right eye shows no AV nicking, no exudate, no hemorrhage and no papilledema.       The left eye shows  no AV nicking, no exudate, no hemorrhage and no papilledema.  Neck: Normal range of motion. Neck supple.  Cardiovascular: Normal rate, regular rhythm and normal heart sounds.   Pulmonary/Chest: Effort normal and breath sounds normal. No respiratory distress. She has no wheezes.  Neurological: She is alert and oriented to person, place, and time.  Psychiatric: She has a normal mood and affect. Her behavior is normal. Judgment and thought content normal.    Assessment and Plan Telena was seen today for anxiety.  Diagnoses and all orders for this visit:  Adjustment disorder with anxious mood- spent 20 minutes listening to patient and discussing options for [facility] such as [facility] Also discussed some tangible approaches to this situation such as seeking help from HR  Acute non intractable tension-type headache Advised pt to keep a headache journal to categorize triggers, headache locations and associated symptoms We will follow up on her headache journal in one month For now she should continue antihistamines, hydration, deep breathing and short naps Discussed fioricet but pt declined -     naproxen (NAPROSYN) 500 MG tablet; Take 1 tablet (500 mg total) by mouth 2 (two) times daily with a meal.   A total of 30 minutes were spent face-to-face with the patient during this encounter and over half of that time was spent on counseling and coordination of care.    [place]

## 2017-02-24 NOTE — Patient Instructions (Signed)
     IF you received an x-ray today, you will receive an invoice from [facility]. Please contact [facility] at [phone] with questions or concerns regarding your invoice.   IF you received labwork today, you will receive an invoice from [facility]. Please contact [facility] at [phone] with questions or concerns regarding your invoice.   Our billing staff will not be able to assist you with questions regarding bills from these companies.  You will be contacted with the lab results as soon as they are available. The fastest way to get your results is to activate your My Chart account. Instructions are located on the last page of this paperwork. If you have not heard from us regarding the results in 2 weeks, please contact this office.     

## 2017-02-26 ENCOUNTER — Encounter: Payer: Self-pay | Admitting: Family Medicine

## 2017-02-26 ENCOUNTER — Ambulatory Visit (INDEPENDENT_AMBULATORY_CARE_PROVIDER_SITE_OTHER): Payer: BC Managed Care – PPO | Admitting: Family Medicine

## 2017-02-26 VITALS — BP 142/92 | HR 81 | Temp 98.6°F | Resp 16 | Ht 70.5 in | Wt 293.0 lb

## 2017-02-26 DIAGNOSIS — G8929 Other chronic pain: Secondary | ICD-10-CM

## 2017-02-26 DIAGNOSIS — I10 Essential (primary) hypertension: Secondary | ICD-10-CM | POA: Diagnosis not present

## 2017-02-26 DIAGNOSIS — R51 Headache: Secondary | ICD-10-CM | POA: Diagnosis not present

## 2017-02-26 DIAGNOSIS — F411 Generalized anxiety disorder: Secondary | ICD-10-CM

## 2017-02-26 MED ORDER — HYDROCHLOROTHIAZIDE 25 MG PO TABS
25.0000 mg | ORAL_TABLET | Freq: Every day | ORAL | 2 refills | Status: DC
Start: 2017-02-26 — End: 2017-03-16

## 2017-02-26 NOTE — Progress Notes (Signed)
Victoria Ewing is a 41 y.o. female who presents to Primary Care at [facility] today for concern for high blood pressure:  1.  High blood pressure:  Patient has never been diagnosed with HTN in past.  Has had some elevated pressures last visit when seen her for anxiety.  She has been checking her BP's at school with her school nurse and their machine.  Systolics have run between 580D systolic and 983J diastolic.  She purchased an at-home BP machine, which showed BPs in the 825K systolic.   Also with daily headaches.  Was checking her BPs while having headaches, noted BPs in 539-767H systolic.  Headaches described as dull, frontal aching.  Not associated with nausea.  Tries not to take OTC meds for this.  Prescribed naproxen last visit but she hasn't picked this up yet.   No epistaxis, no LOC, no falls, no injury.  No numbness or weakness in extremities.    Strong family history of stroke.  Grandmother, father, mother all with stroke.  First cousin with stroke age 94.  Sister with high blood pressure and on meds since age 40.    She herself has never had any stroke symptoms.    ROS as above.  Pertinently, no chest pain, palpitations, SOB, N/V/D.   PMH reviewed. Patient is a nonsmoker.   Past Medical History:  Diagnosis Date  . Allergy   . Anxiety   . Asthma   . Depression    Past Surgical History:  Procedure Laterality Date  . CESAREAN SECTION      Medications reviewed. Current Outpatient Prescriptions  Medication Sig Dispense Refill  . albuterol (PROVENTIL HFA;VENTOLIN HFA) 108 (90 Base) MCG/ACT inhaler Inhale 2 puffs into the lungs every 6 (six) hours as needed for wheezing. 1 Inhaler 1  . ALPRAZolam (XANAX) 0.25 MG tablet TAKE 1 TO 1 & 1/2 TABLETS ONCE DAILY AS NEEDED 45 tablet 0  . buPROPion (WELLBUTRIN XL) 150 MG 24 hr tablet Take 1 tablet (150 mg total) by mouth daily. 30 tablet 3  . cholecalciferol (VITAMIN D) 1000 UNITS tablet Take 2,000 Units by mouth daily. Reported on  02/08/2016    . fluticasone (FLONASE) 50 MCG/ACT nasal spray Place 2 sprays into both nostrils daily. 16 g 6  . hydrocortisone 2.5 % cream Apply topically 2 (two) times daily. 30 g 0  . loratadine (CLARITIN) 10 MG tablet Take 10 mg by mouth daily.    . naproxen (NAPROSYN) 500 MG tablet Take 1 tablet (500 mg total) by mouth 2 (two) times daily with a meal. 30 tablet 6  . triamcinolone ointment (KENALOG) 0.1 % Apply 1 application topically 2 (two) times daily. 30 g 0  . buPROPion (WELLBUTRIN) 75 MG tablet Take 1 tablet (75 mg total) by mouth 2 (two) times daily. (Patient not taking: Reported on 02/24/2017) 60 tablet 6   No current facility-administered medications for this visit.      Physical Exam:  BP (!) 155/92   Pulse 81   Temp 98.6 F (37 C) (Oral)   Resp 16   Ht 5' 10.5" (1.791 m)   Wt 293 lb (132.9 kg)   LMP 02/09/2017 (Approximate)   SpO2 98%   BMI 41.45 kg/m  Gen:  Alert, cooperative patient who appears stated age in no acute distress.  Vital signs reviewed. HEENT: EOMI,  MMM Pulm:  Clear to auscultation bilaterally  Cardiac:  Regular rate and rhythm  Exts: Non edematous BL  LE, warm and well perfused.  Neuro:  Alert and oriented x 4.  No focal deficits noted.  Psych:  Not depressed or anxious appearing currently.   Assessment and Plan:  1.  HTN: - has had multiple elevated BP's measured, including outside of office - rechecked still elevated here -- compared with her home BP monitor, which was 375O systolic.  Told her to not check her BP with this at home because it was worsening her anxiety.  - starting HCTZ 25 mg.  FU in 4 - 6 weeks for BP recheck.    2.  Headache:   - possibly secondary to elevated BPs as this has been daily - she also isn't taking anything for it - told her to fill the naproxen to see if this helps in short term - FU in 4 - 6 weeks to see if improved along with blood pressure  3.  Anxiety:  - hasn't noticed a difference since starting Welbutrin  -- but hasn't really been taking long enough - encouraged to stay on this.

## 2017-02-26 NOTE — Patient Instructions (Addendum)
I have sent in the hydrochlorothiazide 25 mg for you.  Take this pill once a day.  Come back and check in with Dr. Nolon Rod in the next 4-6 weeks so we can see how you're doing on the blood pressure medicine.  This hopefully will help somewhat with your headaches also.   The naproxen Dr. Nolon Rod sent in as a prescription strength pain medicine that may also provide to some relief for still having headaches despite the blood pressure medicine.  It was good to meet you today!    IF you received an x-ray today, you will receive an invoice from [facility]. Please contact [facility] at [phone] with questions or concerns regarding your invoice.   IF you received labwork today, you will receive an invoice from [place]. Please contact [facility] at [phone] with questions or concerns regarding your invoice.   Our billing staff will not be able to assist you with questions regarding bills from these companies.  You will be contacted with the lab results as soon as they are available. The fastest way to get your results is to activate your My Chart account. Instructions are located on the last page of this paperwork. If you have not heard from [place] regarding the results in 2 weeks, please contact this office.

## 2017-03-04 ENCOUNTER — Telehealth: Payer: Self-pay | Admitting: Family Medicine

## 2017-03-04 NOTE — Telephone Encounter (Signed)
I agree with this plan.

## 2017-03-04 NOTE — Telephone Encounter (Signed)
Pt believes that she is having a drug interaction with welbutrin and her new blood pressure medication  She is experiencing dizziness and nausea and when she checked her blood pressure it was 144/103 at 1151am   Best number is [phone]

## 2017-03-04 NOTE — Telephone Encounter (Signed)
Sudden turning of head today caused severe vertigo, nausea. Got less but still is pretty significant. Denies chest pain or sob  Heart racing a couple times yesterday   On wellbutrin for a while  Added new b/p med last week and read can be an interaction. hctz   144/103 b/p today an hour after spell   I transferred her up front for an appt tomorrow, told her to hold hctz in the am, go to er if chest pain or sob  Any other advice?

## 2017-03-05 ENCOUNTER — Encounter: Payer: Self-pay | Admitting: Physician Assistant

## 2017-03-05 ENCOUNTER — Ambulatory Visit (INDEPENDENT_AMBULATORY_CARE_PROVIDER_SITE_OTHER): Payer: BC Managed Care – PPO | Admitting: Physician Assistant

## 2017-03-05 VITALS — BP 120/78 | HR 81 | Temp 98.9°F | Resp 18 | Ht 70.08 in | Wt 289.0 lb

## 2017-03-05 DIAGNOSIS — R42 Dizziness and giddiness: Secondary | ICD-10-CM

## 2017-03-05 DIAGNOSIS — R110 Nausea: Secondary | ICD-10-CM | POA: Diagnosis not present

## 2017-03-05 DIAGNOSIS — Z6841 Body Mass Index (BMI) 40.0 and over, adult: Secondary | ICD-10-CM | POA: Insufficient documentation

## 2017-03-05 DIAGNOSIS — R319 Hematuria, unspecified: Secondary | ICD-10-CM | POA: Diagnosis not present

## 2017-03-05 LAB — POCT URINALYSIS DIP (MANUAL ENTRY)
BILIRUBIN UA: NEGATIVE
BILIRUBIN UA: NEGATIVE mg/dL
GLUCOSE UA: NEGATIVE mg/dL
LEUKOCYTES UA: NEGATIVE
Nitrite, UA: NEGATIVE
Spec Grav, UA: 1.025 (ref 1.010–1.025)
Urobilinogen, UA: 0.2 E.U./dL
pH, UA: 5.5 (ref 5.0–8.0)

## 2017-03-05 LAB — POCT URINE PREGNANCY: Preg Test, Ur: NEGATIVE

## 2017-03-05 MED ORDER — MECLIZINE HCL 25 MG PO TABS: 25.0000 mg | ORAL_TABLET | Freq: Three times a day (TID) | ORAL | 0 refills | Status: DC | PRN

## 2017-03-05 NOTE — Progress Notes (Signed)
Patient ID: Victoria Ewing, female    DOB: [birth year], 41 y.o.   MRN: [id number]  PCP: Forrest Moron, MD  Chief Complaint  Patient presents with  . Dizziness    with nausea x 2 days     Subjective:   Presents for evaluation of dizziness and nausea.  5/22 with anxiety and headache. At that visit she described stress at work, which aggravated her panic disorder. 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 She wanted to go to therapy, but her benefits package does not cover behavioral/mental health services. He BP was elevated, thought likely due to the stress. The HA was thought most likely due to tension and she was prescribed NSAID.  She returned 5/24 with persistent symptoms and elevated BP, reporting readings 160's/100's at work and SBP 190's on a new home cuff. HA persisted, unchanged. She had not started the prescribed NSAID. She was concerned due to a strong family history of stroke, including her mother, grandmother and cousin. Her father has had a TIA. One of her sisters has been on blood pressure medication since age 56. She was started on HCTZ 25 mg 1 PO QD.  Yesterday at work she turned her head to check the time on the clock and developed the sensation that the room was spinning, such that she had to hold on to something to steady herself. Developed nausea. She left work and took a nap, but felt worse upon waking. The spinning isn't so bad, but she feels off balance, like she needs to steady herself on solid objects (the wall, tables, chairs, etc). Movement exacerbates the symptoms, such that she is drinking with straws because tilting her head back to drink provokes the symptoms.  Headache persists, and she describes the sensation that there is a weight pushing down on the top of her head. Blurred vision is not worse than it has been (she reports that she does not wear the prescription glasses she has had for several years).  No  vomiting, diarrhea, recent illness. No paresthesias, weakness. No slurred speech, facial droop, confusion. No urinary symptoms. No pre-syncope. Doesn't think she is pregnant, but isn't certain. Uses condoms.  Is planning to resign from her job and move back to [place]. She has 5 more work days. Believes that her symptoms are directly related to her current job, and specifically one site.     Review of Systems Constitutional: Positive for fatigue.  HENT: Negative for hearing loss and tinnitus.   Eyes: Positive for photophobia.  Gastrointestinal: Positive for nausea. Negative for vomiting.  Neurological: Positive for dizziness and headaches. Negative for facial asymmetry, speech difficulty, weakness, light-headedness and numbness.       Unsteady on her feet, off balance     Patient Active Problem List   Diagnosis Date Noted  . Eczema 12/14/2014  . Asthma, chronic 08/30/2014  . Anxiety state 07/22/2013  . Allergic rhinitis 07/22/2013     Prior to Admission medications   Medication Sig Start Date End Date Taking? Authorizing Provider  albuterol (PROVENTIL HFA;VENTOLIN HFA) 108 (90 Base) MCG/ACT inhaler Inhale 2 puffs into the lungs every 6 (six) hours as needed for wheezing. 11/05/16   Forrest Moron, MD  ALPRAZolam (XANAX) 0.25 MG tablet TAKE 1 TO 1 & 1/2 TABLETS ONCE DAILY AS NEEDED 02/03/17   Delia Chimes A, MD  buPROPion (WELLBUTRIN XL) 150 MG 24 hr tablet Take 1 tablet (150 mg total) by mouth daily.  12/31/16   Forrest Moron, MD  cholecalciferol (VITAMIN D) 1000 UNITS tablet Take 2,000 Units by mouth daily. Reported on 02/08/2016    [provider]  fluticasone (FLONASE) 50 MCG/ACT nasal spray Place 2 sprays into both nostrils daily. 12/31/16   Forrest Moron, MD  hydrochlorothiazide (HYDRODIURIL) 25 MG tablet Take 1 tablet (25 mg total) by mouth daily. 02/26/17   Alveda Reasons, MD  hydrocortisone 2.5 % cream Apply topically 2 (two) times daily. 08/11/16    Forrest Moron, MD  loratadine (CLARITIN) 10 MG tablet Take 10 mg by mouth daily.    [provider]  naproxen (NAPROSYN) 500 MG tablet Take 1 tablet (500 mg total) by mouth 2 (two) times daily with a meal. 02/24/17   Delia Chimes A, MD  triamcinolone ointment (KENALOG) 0.1 % Apply 1 application topically 2 (two) times daily. 12/19/15   Jaynee Eagles, PA-C     Allergies  Allergen Reactions  . Other Anaphylaxis    Pecans and nuts        Objective:  Physical Exam  Constitutional: She is oriented to person, place, and time. She appears well-developed and well-nourished. She is active and cooperative. No distress.  BP 120/78 (BP Location: Right Arm, Patient Position: Sitting, Cuff Size: Large)   Pulse 81   Temp 98.9 F (37.2 C) (Oral)   Resp 18   Ht 5' 10.08" (1.78 m)   Wt 289 lb (131.1 kg)   LMP 02/09/2017 (Approximate)   SpO2 99%   BMI 41.37 kg/m   HENT:  Head: Normocephalic and atraumatic.  Right Ear: Hearing normal.  Left Ear: Hearing normal.  Eyes: Conjunctivae are normal. No scleral icterus.  Neck: Normal range of motion. Neck supple. No thyromegaly present.  Cardiovascular: Normal rate, regular rhythm and normal heart sounds.   Pulses:      Radial pulses are 2+ on the right side, and 2+ on the left side.  Pulmonary/Chest: Effort normal and breath sounds normal.  Lymphadenopathy:       Head (right side): No tonsillar, no preauricular, no posterior auricular and no occipital adenopathy present.       Head (left side): No tonsillar, no preauricular, no posterior auricular and no occipital adenopathy present.    She has no cervical adenopathy.       Right: No supraclavicular adenopathy present.       Left: No supraclavicular adenopathy present.  Neurological: She is alert and oriented to person, place, and time. She has normal strength. No cranial nerve deficit or sensory deficit. She displays a negative Romberg sign. Coordination and gait normal.  Reflex  Scores:      Bicep reflexes are 2+ on the right side and 2+ on the left side.      Patellar reflexes are 2+ on the right side and 2+ on the left side.      Achilles reflexes are 2+ on the right side and 2+ on the left side. No nystagmus.  Skin: Skin is warm, dry and intact. No rash noted. No cyanosis or erythema. Nails show no clubbing.  Psychiatric: She has a normal mood and affect. Her speech is normal and behavior is normal.    EKG reviewed with Dr. Tamala Julian. NSR with single PVC. No previous tracing for comparison.   Results for orders placed or performed in visit on 03/05/17  POCT urinalysis dipstick  Result Value Ref Range   Color, UA straw (A) yellow   Clarity, UA turbid (A) clear  Glucose, UA negative negative mg/dL   Bilirubin, UA negative negative   Ketones, POC UA negative negative mg/dL   Spec Grav, UA 1.025 1.010 - 1.025   Blood, UA small (A) negative   pH, UA 5.5 5.0 - 8.0   Protein Ur, POC =30 (A) negative mg/dL   Urobilinogen, UA 0.2 0.2 or 1.0 E.U./dL   Nitrite, UA Negative Negative   Leukocytes, UA Negative Negative  POCT urine pregnancy  Result Value Ref Range   Preg Test, Ur Negative Negative       Assessment & Plan:   1. Dizziness Likely BPPV. Increase oral hydration, rest, continue her current medications, but reduce HCTZ to 1/2 tablet. Check BP at home, and if SBP<100, stop HCTZ. Trial of meclizine, with sedation warning. - CBC with Differential/Platelet - Comprehensive metabolic panel - TSH - T4, free - POCT urinalysis dipstick - EKG 12-Lead - POCT urine pregnancy - meclizine (ANTIVERT) 25 MG tablet; Take 1 tablet (25 mg total) by mouth 3 (three) times daily as needed for dizziness.  Dispense: 30 tablet; Refill: 0  2. Nausea without vomiting Due to #1.  3. Hematuria, unspecified type Asymptomatic. Await micro. Would repeat in 1-2 weeks. - Urine Microscopic   Return for re-evaluation in 1-2 weeks with Darlin Coco, PA-C or Dr.  Nolon Rod.   Fara Chute, PA-C [facility]

## 2017-03-05 NOTE — Patient Instructions (Addendum)
1. REDUCE the HCTZ from 25 mg to 12.5 mg each morning.  2. Continue the Wellbutrin at the current dose. 3. Use the alprazolam as needed. 4. Try the meclizine to help the dizziness. It causes drowsiness, so you should use it with caution. 5. Check your blood pressure one time each day. 6. Drink at least 64 ounces of water each day.    IF you received an x-ray today, you will receive an invoice from [facility]. Please contact [facility] at [phone] with questions or concerns regarding your invoice.   IF you received labwork today, you will receive an invoice from [place]. Please contact [facility] at [phone] with questions or concerns regarding your invoice.   Our billing staff will not be able to assist you with questions regarding bills from these companies.  You will be contacted with the lab results as soon as they are available. The fastest way to get your results is to activate your My Chart account. Instructions are located on the last page of this paperwork. If you have not heard from [place] regarding the results in 2 weeks, please contact this office.

## 2017-03-05 NOTE — Progress Notes (Signed)
Subjective:    Patient ID: Victoria Ewing, female    DOB: June 07, 1976, 41 y.o.   MRN: [id number] Chief Complaint  Patient presents with  . Dizziness    with nausea x 2 days     HPI  Patient presents today complaining of dizziness and nausea x2 days. She was seen on 5/22 for anxiety where she was found to have increased BP. She monitored it at home for a few day because she was concerned with her family history of HTN and CVA and came back to be seen on 5/24 for elevated BP. She was prescribed 25 mg HCTZ at that time. She also started having headaches on 5/24, located in the middle of her forehead and was stressed because she was unsure if it was related to her increased blood pressure. Patient recently started taking her Xanax PRN rather than everyday and has been having some headaches associated with withdrawal.  Yesterday at work she turned her head to check the time when the room started spinning. She states that she had to hold onto the table to keep herself oriented. She also became nauseous at that time. She states that she was not feeling stressed at the time on symptom onset. She went home and took a nap and felt more dizzy upon waking. She states that she has felt unsteady on her feet and off balance having to use the wall at times to walk. Movement exacerbates the dizziness to the point of having to use a straw to drink out of a water bottle because the tilting of her head causes her to become dizzy. She also states that her forehead and the top of her head feel "heavy or weighted down" bilaterally. Admits to some blurry vision but that has been occurring for a few years, and she just needs to blink to refocus her eyes. She does have eyewear that she rarely wears.  She has recently been stressed at work and with a family illness- her cousin recently had a stroke at age 31. She does not know if this is all related to her episode of anxiety that she feels has not completely resolved. She  states that the room is not spinning now but that she feels off. Denies vomiting, diarrhea, recent illness, tingling/paresthesias or change in sensations, weakness, difficulty speaking, difficulty with word finding, or urinary symptoms.  Does not know if she is pregnant, but uses condoms for birth control.   Past Medical History:  Diagnosis Date  . Allergy   . Anxiety   . Asthma   . Depression    Prior to Admission medications   Medication Sig Start Date End Date Taking? Authorizing Provider  albuterol (PROVENTIL HFA;VENTOLIN HFA) 108 (90 Base) MCG/ACT inhaler Inhale 2 puffs into the lungs every 6 (six) hours as needed for wheezing. 11/05/16   Forrest Moron, MD  ALPRAZolam (XANAX) 0.25 MG tablet TAKE 1 TO 1 & 1/2 TABLETS ONCE DAILY AS NEEDED 02/03/17   Delia Chimes A, MD  buPROPion (WELLBUTRIN XL) 150 MG 24 hr tablet Take 1 tablet (150 mg total) by mouth daily. 12/31/16   Forrest Moron, MD  cholecalciferol (VITAMIN D) 1000 UNITS tablet Take 2,000 Units by mouth daily. Reported on 02/08/2016    [provider]  fluticasone (FLONASE) 50 MCG/ACT nasal spray Place 2 sprays into both nostrils daily. 12/31/16   Forrest Moron, MD  hydrochlorothiazide (HYDRODIURIL) 25 MG tablet Take 1 tablet (25 mg total) by mouth daily. 02/26/17  Alveda Reasons, MD  hydrocortisone 2.5 % cream Apply topically 2 (two) times daily. 08/11/16   Forrest Moron, MD  loratadine (CLARITIN) 10 MG tablet Take 10 mg by mouth daily.    [provider]  naproxen (NAPROSYN) 500 MG tablet Take 1 tablet (500 mg total) by mouth 2 (two) times daily with a meal. 02/24/17   Delia Chimes A, MD  triamcinolone ointment (KENALOG) 0.1 % Apply 1 application topically 2 (two) times daily. 12/19/15   Jaynee Eagles, PA-C   Allergies  Allergen Reactions  . Other Anaphylaxis    Pecans and nuts     Family History  Problem Relation Age of Onset  . Cancer Mother        pancreatic  . Stroke Mother 85  . Diabetes  Mother   . Kidney disease Mother        transplant in 2010  . Hypertension Mother   . Transient ischemic attack Father 67  . Cancer Father        prostate cancer?  . Stroke Maternal Grandmother 44  . Hypertension Sister 23  . Stroke Cousin 45  . Rheum arthritis Cousin      Review of Systems  Constitutional: Positive for fatigue.  HENT: Negative for hearing loss and tinnitus.   Eyes: Positive for photophobia.  Gastrointestinal: Positive for nausea. Negative for vomiting.  Neurological: Positive for dizziness and headaches. Negative for facial asymmetry, speech difficulty, weakness, light-headedness and numbness.       Unsteady on her feet, off balance       Objective:   Physical Exam  Constitutional: She is oriented to person, place, and time. She appears well-developed and well-nourished.  Eyes: Conjunctivae and EOM are normal. Pupils are equal, round, and reactive to light.  Without nystagmus  Neck: Normal range of motion.  Cardiovascular: Normal rate, regular rhythm, normal heart sounds and intact distal pulses.   Pulmonary/Chest: Effort normal and breath sounds normal.  Neurological: She is alert and oriented to person, place, and time. She has normal strength. No cranial nerve deficit or sensory deficit.      BP 120/78 (BP Location: Right Arm, Patient Position: Sitting, Cuff Size: Large)   Pulse 81   Temp 98.9 F (37.2 C) (Oral)   Resp 18   Ht 5' 10.08" (1.78 m)   Wt 289 lb (131.1 kg)   LMP 02/09/2017 (Approximate)   SpO2 99%   BMI 41.37 kg/m      Assessment & Plan:  1. Dizziness Most likely due to benign paroxysmal positional vertigo or addition of HCTZ to medication regimen. Rule out anemia, pregnancy, thyroid pathology and arrhythmia as cause of symptoms. Reduce HCTZ from 25 to 12.5 mg tablet to see if dizziness improves.  - CBC with Differential/Platelet - Comprehensive metabolic panel - TSH - T4, free - POCT urinalysis dipstick - EKG 12-Lead - POCT  urine pregnancy  2. Nausea without vomiting - meclizine (ANTIVERT) 25 MG tablet; Take 1 tablet (25 mg total) by mouth 3 (three) times daily as needed for dizziness.  Dispense: 30 tablet; Refill: 0  3. Hematuria, unspecified type - Urine Microscopic  4. Essential hypertension Reduced HCTZ from 25 to 12.5 mg tablet by cutting tablet in half. Continue to monitor blood pressure at home.  Follow up in 1-2 weeks for re-evaluation of dizziness and blood pressure.

## 2017-03-06 LAB — CBC WITH DIFFERENTIAL/PLATELET
Basophils Absolute: 0 10*3/uL (ref 0.0–0.2)
Basos: 1 %
EOS (ABSOLUTE): 0.3 10*3/uL (ref 0.0–0.4)
EOS: 7 %
HEMATOCRIT: 42.4 % (ref 34.0–46.6)
Hemoglobin: 13.5 g/dL (ref 11.1–15.9)
IMMATURE GRANULOCYTES: 0 %
Immature Grans (Abs): 0 10*3/uL (ref 0.0–0.1)
LYMPHS ABS: 1.8 10*3/uL (ref 0.7–3.1)
Lymphs: 41 %
MCH: 27.9 pg (ref 26.6–33.0)
MCHC: 31.8 g/dL (ref 31.5–35.7)
MCV: 88 fL (ref 79–97)
MONOS ABS: 0.4 10*3/uL (ref 0.1–0.9)
Monocytes: 8 %
NEUTROS PCT: 43 %
Neutrophils Absolute: 1.9 10*3/uL (ref 1.4–7.0)
Platelets: 284 10*3/uL (ref 150–379)
RBC: 4.84 x10E6/uL (ref 3.77–5.28)
RDW: 14.4 % (ref 12.3–15.4)
WBC: 4.3 10*3/uL (ref 3.4–10.8)

## 2017-03-06 LAB — COMPREHENSIVE METABOLIC PANEL
A/G RATIO: 1.4 (ref 1.2–2.2)
ALT: 24 IU/L (ref 0–32)
AST: 16 IU/L (ref 0–40)
Albumin: 4.3 g/dL (ref 3.5–5.5)
Alkaline Phosphatase: 48 IU/L (ref 39–117)
BILIRUBIN TOTAL: 0.4 mg/dL (ref 0.0–1.2)
BUN / CREAT RATIO: 15 (ref 9–23)
BUN: 14 mg/dL (ref 6–20)
CALCIUM: 9.2 mg/dL (ref 8.7–10.2)
CHLORIDE: 101 mmol/L (ref 96–106)
CO2: 25 mmol/L (ref 18–29)
Creatinine, Ser: 0.95 mg/dL (ref 0.57–1.00)
GFR calc non Af Amer: 76 mL/min/{1.73_m2} (ref >59)
GFR, EST AFRICAN AMERICAN: 88 mL/min/{1.73_m2} (ref >59)
GLOBULIN, TOTAL: 3.1 g/dL (ref 1.5–4.5)
Glucose: 90 mg/dL (ref 65–99)
POTASSIUM: 4.3 mmol/L (ref 3.5–5.2)
SODIUM: 140 mmol/L (ref 134–144)
Total Protein: 7.4 g/dL (ref 6.0–8.5)

## 2017-03-06 LAB — T4, FREE: Free T4: 1.37 ng/dL (ref 0.82–1.77)

## 2017-03-06 LAB — TSH: TSH: 0.738 u[IU]/mL (ref 0.450–4.500)

## 2017-03-16 ENCOUNTER — Encounter: Payer: Self-pay | Admitting: Urgent Care

## 2017-03-16 ENCOUNTER — Ambulatory Visit: Payer: BC Managed Care – PPO | Admitting: Physician Assistant

## 2017-03-16 ENCOUNTER — Ambulatory Visit (INDEPENDENT_AMBULATORY_CARE_PROVIDER_SITE_OTHER): Payer: BC Managed Care – PPO | Admitting: Urgent Care

## 2017-03-16 VITALS — BP 142/94 | HR 88 | Temp 98.3°F | Resp 18 | Ht 70.08 in | Wt 294.0 lb

## 2017-03-16 DIAGNOSIS — F411 Generalized anxiety disorder: Secondary | ICD-10-CM

## 2017-03-16 DIAGNOSIS — F329 Major depressive disorder, single episode, unspecified: Secondary | ICD-10-CM

## 2017-03-16 DIAGNOSIS — F32A Depression, unspecified: Secondary | ICD-10-CM

## 2017-03-16 DIAGNOSIS — I10 Essential (primary) hypertension: Secondary | ICD-10-CM | POA: Diagnosis not present

## 2017-03-16 DIAGNOSIS — R030 Elevated blood-pressure reading, without diagnosis of hypertension: Secondary | ICD-10-CM | POA: Diagnosis not present

## 2017-03-16 MED ORDER — AMLODIPINE BESYLATE 5 MG PO TABS: 5.0000 mg | ORAL_TABLET | Freq: Every day | ORAL | 1 refills | Status: DC

## 2017-03-16 NOTE — Patient Instructions (Addendum)
Stop hydrochlorothiazide. Start amlodipine 5mg . Check your blood pressure in the morning after sitting/resting for 5-10 minutes. If it is above 140, please let me know so that we can come up with a plan to manage it better. If you experience side effects with amlodipine, let me know and we will switch back to 1/2 tablet (12.5mg ) of hydrochlorothiazide.   Aliskiren; Amlodipine oral tablets What is this medicine? ALISKIREN; AMLODIPINE (a lis KYE ren; am LOE di peen) is a combination of a renin inhibitor and a calcium channel blocker. This medicine is used to treat high blood pressure. This medicine may be used for other purposes; ask your health care provider or pharmacist if you have questions. COMMON BRAND NAME(S): Tekamlo What should I tell my health care provider before I take this medicine? They need to know if you have any of these conditions: -dehydration -diabetes -heart problems like heart failure or aortic stenosis -kidney disease -liver disease -an unusual or allergic reaction to aliskiren, amlodipine, other medicines, foods, dyes, or preservatives -pregnant or trying to get pregnant -breast-feeding How should I use this medicine? Take this medicine by mouth with a glass of water. Follow the directions on your prescription label. You may take this medicine with or without food, but try to take it the same way every time. Take your medicine at regular intervals. Do not take it more often than directed. Do not stop taking except on your doctor's advice. Talk to your pediatrician regarding the use of this medicine in children. Special care may be needed. Overdosage: If you think you have taken too much of this medicine contact a poison control center or emergency room at once. NOTE: This medicine is only for you. Do not share this medicine with others. What if I miss a dose? If you miss a dose, take it as soon as you can. If it is almost time for your next dose, take only that dose. Do  not take double or extra doses. What may interact with this medicine? -atorvastatin -certain medicines for fungal infections like ketoconazole and itraconazole -cyclosporine -diuretics -medicines for blood pressure -potassium supplements -salt substitutes with potassium This list may not describe all possible interactions. Give your health care provider a list of all the medicines, herbs, non-prescription drugs, or dietary supplements you use. Also tell them if you smoke, drink alcohol, or use illegal drugs. Some items may interact with your medicine. What should I watch for while using this medicine? Visit your doctor or health care professional for regular checks on your progress. Check your blood pressure as directed. Ask your doctor or health care professional what your blood pressure should be and when you should contact him or her. If you have diabetes and are taking a medicine called an angiotensin-receptor-blocker (ARB) or angiotensin-converting-enzyme-inhibitor (ACE inhibitor), do not take this medicine. Talk to your doctor or health care professional for more information. Women should inform their doctor if they wish to become pregnant or think they might be pregnant. There is a potential for serious side effects to an unborn child. Talk to your health care professional or pharmacist for more information. This medicine may make you feel confused, dizzy or lightheaded. Do not drive, use machinery, or do anything that needs mental alertness until you know how this medicine affects you. To reduce the risk of dizzy or fainting spells, do not sit or stand up quickly, especially if you are an older patient. Avoid alcoholic drinks; they can make you more dizzy. What side  effects may I notice from receiving this medicine? Side effects that you should report to your doctor or health care professional as soon as possible: -allergic reactions like skin rash or hives, swelling of the hands, feet,  face, lips, throat, or tongue -breathing problems -chest pain -fast, irregular heartbeat -feeling faint or lightheaded, falls -low blood pressure -seizures -swelling of ankles, feet, hands Side effects that usually do not require medical attention (report to your doctor or health care professional if they continue or are bothersome): -cough -diarrhea -dry mouth -facial flushing -nausea, vomiting -upset stomach -weak or tired This list may not describe all possible side effects. Call your doctor for medical advice about side effects. You may report side effects to FDA at 1-800-FDA-1088. Where should I keep my medicine? Keep out of the reach of children. Store at room temperature between 15 and 30 degrees C (59 and 86 degrees F). Protect from heat and moisture. Throw away any unused medicine after the expiration date. NOTE: This sheet is a summary. It may not cover all possible information. If you have questions about this medicine, talk to your doctor, pharmacist, or health care provider.  2018 Elsevier/Gold Standard (2015-10-25 10:43:52)      IF you received an x-ray today, you will receive an invoice from [facility]. Please contact [facility] Rad[facility] at [phone] with questions or concerns regarding your invoice.   IF you received labwork today, you will receive an invoice from [place]. Please contact [facility] at [phone] with questions or concerns regarding your invoice.   Our billing staff will not be able to assist you with questions regarding bills from these companies.  You will be contacted with the lab results as soon as they are available. The fastest way to get your results is to activate your My Chart account. Instructions are located on the last page of this paperwork. If you have not heard from [place] regarding the results in 2 weeks, please contact this office.

## 2017-03-16 NOTE — Progress Notes (Signed)
    MRN: [id number] DOB: [birth year]  Subjective:   Victoria Ewing is a 41 y.o. female presenting for follow up on Hypertension.   Currently managed with 1/2 tablet of HCTZ. She admits taking a tablet every other day. Patient is checking blood pressure at home, generally 539'J-673'A systolic. Avoids salt in diet, is not exercising consistently. Reports intermittent headaches, dizziness (worse when taking 25mg  HCTZ) with associated nausea. Denies chest pain, shortness of breath, heart racing, palpitations, vomiting, abdominal pain, hematuria, lower leg swelling. Denies smoking cigarettes. Has rare alcohol drink. Patient is under a lot of stress. Her mother passed, her sister moved away, she has a toddler. Her job can be stressful as well. She is taking Wellbutrin for this. Patient has seen Dr. Nolon Rod, Dr. Mingo Amber, PA-Jeffery and myself today. Patient is leaving town on 03/21/2017 and will not return until after 04/08/2017.   Victoria Ewing has a current medication list which includes the following prescription(s): albuterol, alprazolam, bupropion, cholecalciferol, fluticasone, hydrochlorothiazide, hydrocortisone, loratadine, meclizine, naproxen, and triamcinolone ointment. Also is allergic to other. Winry  has a past medical history of Allergy; Anxiety; Asthma; and Depression. Also  has a past surgical history that includes Cesarean section.  Objective:   Vitals: BP (!) 142/94   Pulse 88   Temp 98.3 F (36.8 C) (Oral)   Resp 18   Ht 5' 10.08" (1.78 m)   Wt 294 lb (133.4 kg)   LMP 03/08/2017   SpO2 96%   BMI 42.09 kg/m   BP Readings from Last 3 Encounters:  03/16/17 (!) 142/94  03/05/17 120/78  02/26/17 (!) 142/92   Physical Exam  Constitutional: She is oriented to person, place, and time. She appears well-developed and well-nourished.  HENT:  Mouth/Throat: Oropharynx is clear and moist.  Eyes: No scleral icterus.  Neck: Normal range of motion. Neck supple. No thyromegaly present.   Cardiovascular: Normal rate, regular rhythm and intact distal pulses.  Exam reveals no gallop and no friction rub.   No murmur heard. Pulmonary/Chest: No respiratory distress. She has no wheezes. She has no rales.  Abdominal: Soft. Bowel sounds are normal. She exhibits no distension and no mass. There is no tenderness. There is no guarding.  Musculoskeletal: She exhibits no edema.  Neurological: She is alert and oriented to person, place, and time.  Skin: Skin is warm and dry.   Assessment and Plan :   1. Essential hypertension 2. Elevated blood pressure reading - Will switch to amlodipine given potential adverse effect of headaches and dizziness which I explained to the patient may also be due to fluctuating blood pressure. Patient is to check her BP at home and if she is not managed well, may need to go back to HCTZ or add it on to amlodipine. Patient is going to leave out of town until f/u, rtc in 4 weeks.  3. Anxiety state 4. Depression, unspecified depression type 5. Morbid obesity (HCC) - Wellbutrin may be affecting her BP and anxiety. Discussed switching therapies here, patient will rtc to discuss this after her trip.  Jaynee Eagles, PA-C [facility] [phone] 03/16/2017  9:47 AM

## 2017-04-13 ENCOUNTER — Telehealth: Payer: Self-pay | Admitting: Family Medicine

## 2017-04-13 ENCOUNTER — Other Ambulatory Visit: Payer: Self-pay

## 2017-04-13 MED ORDER — ALPRAZOLAM 0.25 MG PO TABS: ORAL_TABLET | ORAL | 0 refills | Status: DC

## 2017-04-13 NOTE — Telephone Encounter (Signed)
DR STALLING PT CALLING FOR A REFILL ON HER XANAX SHE IS IN [place] AND WOULD LIKE [facility] THE PHONE NUMBER [facility] [phone] SHE WILL ONLY BE THERE UNTIL Sunday AND THEN SHE WILL BE TRAVELING TO [place] PLEASE CALL PT AND PHARMACY

## 2017-04-13 NOTE — Telephone Encounter (Signed)
Spoke with pt and reordered.

## 2017-04-15 ENCOUNTER — Ambulatory Visit (INDEPENDENT_AMBULATORY_CARE_PROVIDER_SITE_OTHER): Payer: BC Managed Care – PPO | Admitting: Urgent Care

## 2017-04-15 ENCOUNTER — Encounter: Payer: Self-pay | Admitting: Urgent Care

## 2017-04-15 VITALS — BP 142/86 | HR 82 | Temp 97.9°F | Resp 18 | Ht 70.0 in | Wt 297.2 lb

## 2017-04-15 DIAGNOSIS — F411 Generalized anxiety disorder: Secondary | ICD-10-CM | POA: Diagnosis not present

## 2017-04-15 DIAGNOSIS — R030 Elevated blood-pressure reading, without diagnosis of hypertension: Secondary | ICD-10-CM | POA: Diagnosis not present

## 2017-04-15 DIAGNOSIS — R51 Headache: Secondary | ICD-10-CM

## 2017-04-15 DIAGNOSIS — I10 Essential (primary) hypertension: Secondary | ICD-10-CM

## 2017-04-15 DIAGNOSIS — R519 Headache, unspecified: Secondary | ICD-10-CM

## 2017-04-15 MED ORDER — VENLAFAXINE HCL ER 75 MG PO CP24: 75.0000 mg | ORAL_CAPSULE | Freq: Every day | ORAL | 1 refills | Status: DC

## 2017-04-15 NOTE — Progress Notes (Signed)
   MRN: [id number] DOB: December 10, 1975  Subjective:   Victoria Ewing is a 41 y.o. female presenting for follow up on HTN. Patient was started on amlodipine due to AE's with HCTZ. Admits non-compliance with amlodipine. Her last OV was 03/16/2017. We also discussed possibility of Wellbutrin being a source of her HTN but due to her travel, we decided to hold off on changing therapies until she returned. Today, she reports frequent headaches, heart pounding. Has difficulty sleeping, does not really know how many hours she is getting. Patient was planning on leaving [place] to move to [place]. She is now having to make a decision to stay here in [place], has a 89 year old daughter. This has been a stressor to patient. Her mood is okay but has a lot of anxiety, is still grieving the loss of her mother in 2014. Would like information for counseling. Denies SI, HI. She admits that anxiety has always been an issue. Has failed buspirone, Zoloft, Lexapro. Denies dizziness, chest pain, shortness of breath, heart racing, palpitations, nausea, vomiting, abdominal pain, hematuria, lower leg swelling. Denies smoking cigarettes. Has rare alcohol drink.   Jovanna has a current medication list which includes the following prescription(s): albuterol, alprazolam, amlodipine, bupropion, cholecalciferol, fluticasone, hydrocortisone, loratadine, meclizine, naproxen, and triamcinolone ointment. Also is allergic to other. Joletta  has a past medical history of Allergy; Anxiety; Asthma; and Depression. Also  has a past surgical history that includes Cesarean section.  Objective:   Vitals: BP (!) 142/86   Pulse 82   Temp 97.9 F (36.6 C) (Oral)   Resp 18   Ht 5\' 10"  (1.778 m)   Wt 297 lb 3.2 oz (134.8 kg)   LMP 04/09/2017   SpO2 98%   BMI 42.64 kg/m   BP Readings from Last 3 Encounters:  04/15/17 (!) 142/86  03/16/17 (!) 142/94  03/05/17 120/78   Physical Exam  Constitutional: She is oriented to person, place, and time.  She appears well-developed and well-nourished.  Eyes: No scleral icterus.  Cardiovascular: Normal rate, regular rhythm and intact distal pulses.  Exam reveals no gallop and no friction rub.   No murmur heard. Pulmonary/Chest: No respiratory distress. She has no wheezes. She has no rales.  Musculoskeletal: She exhibits no edema.  Neurological: She is alert and oriented to person, place, and time.  Psychiatric: She has a normal mood and affect.   Assessment and Plan :   1. Essential hypertension 2. Elevated blood pressure reading 3. Morbid obesity ([place]) - Uncontrolled likely due to non-compliance with medical therapy and emotional stress/anxiety. Patient refused to increase amlodipine dose. She will try to be consistent with medication and diet. Return-to-clinic precautions discussed, patient verbalized understanding.   4. Anxiety state 5. Generalized headaches - Counseled patient on anxiety, will switch from Wellbutrin to Effexor. Counseled patient on potential for adverse effects with medications prescribed today, patient verbalized understanding. Patient will try to find a therapist from the list of resources provided.  Jaynee Eagles, PA-C [facility] [phone] 04/15/2017 5:10 PM

## 2017-04-15 NOTE — Patient Instructions (Addendum)
Start decreasing Wellbutrin to 75mg  daily. At the same time start taking venlafaxine SR/ER 75mg  daily. The plan would be to recheck with you in 6 weeks.    [facility] [address] [place], [place] [address]  Burnard Leigh [phone]  Horton Finer [phone]  Everardo Beals [phone]   [facility] ([address] [place]) - [phone]  [facility]) - [facility] - [phone]  [facility] [place] - [phone]  [facility]  - [phone] (do not file insurance)     Venlafaxine extended-release tablets What is this medicine? VENLAFAXINE (VEN la fax een) is used to treat depression and anxiety. This medicine may be used for other purposes; ask your health care provider or pharmacist if you have questions. COMMON BRAND NAME(S): Venlafaxine What should I tell my health care provider before I take this medicine? They need to know if you have any of these conditions: -bleeding disorders -glaucoma -heart disease -high blood pressure -high cholesterol -kidney disease -liver disease -low levels of sodium in the blood -mania or bipolar disorder -seizures -suicidal thoughts, plans, or attempt; a previous suicide attempt by you or a family -take medicines that treat or prevent blood clots -thyroid disease -an unusual or allergic reaction to venlafaxine, desvenlafaxine, other medicines, foods, dyes, or preservatives -pregnant or trying to get pregnant -breast-feeding How should I use this medicine? Take this medicine by mouth with a full glass of water. Follow the directions on the prescription label. Do not cut, crush, or chew this medicine. Take it with food. Try to take your medicine at about the same time each day. Do not take your medicine more often than  directed. Do not stop taking this medicine suddenly except upon the advice of your doctor. Stopping this medicine too quickly may cause serious side effects or your condition may worsen. A special MedGuide will be given to you by the pharmacist with each prescription and refill. Be sure to read this information carefully each time. Talk to your pediatrician regarding the use of this medicine in children. Special care may be needed. Overdosage: If you think you have taken too much of this medicine contact a poison control center or emergency room at once. NOTE: This medicine is only for you. Do not share this medicine with others. What if I miss a dose? If you miss a dose, take it as soon as you can. If it is almost time for your next dose, take only that dose. Do not take double or extra doses. What may interact with this medicine? Do not take this medicine with any of the following medications: -certain medicines for fungal infections like fluconazole, itraconazole, ketoconazole, posaconazole, voriconazole -cisapride -desvenlafaxine -dofetilide -dronedarone -duloxetine -levomilnacipran -linezolid -MAOIs like Carbex, Eldepryl, Marplan, Nardil, and Parnate -methylene blue (injected into a vein) -milnacipran -pimozide -thioridazine -ziprasidone This medicine may also interact with the following medications: -amphetamines -aspirin and aspirin-like medicines -certain medicines for depression, anxiety, or psychotic disturbances -certain medicines for migraine headaches like almotriptan, eletriptan, frovatriptan, naratriptan, rizatriptan, sumatriptan, zolmitriptan -certain medicines for sleep -certain medicines that treat or prevent blood clots like dalteparin, enoxaparin, warfarin -cimetidine -clozapine -diuretics -fentanyl -furazolidone -indinavir -isoniazid -lithium -metoprolol -NSAIDS, medicines for pain and inflammation, like ibuprofen or naproxen -other medicines that prolong  the QT interval (cause an abnormal heart rhythm) -procarbazine -rasagiline -supplements like St. John's wort, kava kava, valerian -tramadol -tryptophan This list  may not describe all possible interactions. Give your health care provider a list of all the medicines, herbs, non-prescription drugs, or dietary supplements you use. Also tell them if you smoke, drink alcohol, or use illegal drugs. Some items may interact with your medicine. What should I watch for while using this medicine? Tell your doctor if your symptoms do not get better or if they get worse. Visit your doctor or health care professional for regular checks on your progress. Because it may take several weeks to see the full effects of this medicine, it is important to continue your treatment as prescribed by your doctor. Patients and their families should watch out for new or worsening thoughts of suicide or depression. Also watch out for sudden changes in feelings such as feeling anxious, agitated, panicky, irritable, hostile, aggressive, impulsive, severely restless, overly excited and hyperactive, or not being able to sleep. If this happens, especially at the beginning of treatment or after a change in dose, call your health care professional. This medicine can cause an increase in blood pressure. Check with your doctor for instructions on monitoring your blood pressure while taking this medicine. You may get drowsy or dizzy. Do not drive, use machinery, or do anything that needs mental alertness until you know how this medicine affects you. Do not stand or sit up quickly, especially if you are an older patient. This reduces the risk of dizzy or fainting spells. Alcohol may interfere with the effect of this medicine. Avoid alcoholic drinks. Your mouth may get dry. Chewing sugarless gum, sucking hard candy and drinking plenty of water will help. Contact your doctor if the problem does not go away or is severe. What side effects may I  notice from receiving this medicine? Side effects that you should report to your doctor or health care professional as soon as possible: -allergic reactions like skin rash, itching or hives, swelling of the face, lips, or tongue -anxious -breathing problems -confusion -changes in vision -chest pain -confusion -elevated mood, decreased need for sleep, racing thoughts, impulsive behavior -eye pain -fast, irregular heartbeat -feeling faint or lightheaded, falls -feeling agitated, angry, or irritable -hallucination, loss of contact with reality -high blood pressure -loss of balance or coordination -palpitations -redness, blistering, peeling or loosening of the skin, including inside the mouth -restlessness, pacing, inability to keep still -seizures -stiff muscles -suicidal thoughts or other mood changes -trouble passing urine or change in the amount of urine -trouble sleeping -unusual bleeding or bruising -unusually weak or tired -vomiting Side effects that usually do not require medical attention (report to your doctor or health care professional if they continue or are bothersome): -change in sex drive or performance -change in appetite or weight -constipation -dizziness -dry mouth -headache -increased sweating -nausea -tired This list may not describe all possible side effects. Call your doctor for medical advice about side effects. You may report side effects to FDA at 1-800-FDA-1088. Where should I keep my medicine? Keep out of the reach of children. Store at a controlled temperature between 15 and 30 degrees C (59 degrees and 86 degrees F). Throw away any unused medicine after the expiration date. NOTE: This sheet is a summary. It may not cover all possible information. If you have questions about this medicine, talk to your doctor, pharmacist, or health care provider.  2018 Elsevier/Gold Standard (2016-02-21 18:40:09)     Hypertension Hypertension, commonly called  high blood pressure, is when the force of blood pumping through the arteries is too strong. The  arteries are the blood vessels that carry blood from the heart throughout the body. Hypertension forces the heart to work harder to pump blood and may cause arteries to become narrow or stiff. Having untreated or uncontrolled hypertension can cause heart attacks, strokes, kidney disease, and other problems. A blood pressure reading consists of a higher number over a lower number. Ideally, your blood pressure should be below 120/80. The first ("top") number is called the systolic pressure. It is a measure of the pressure in your arteries as your heart beats. The second ("bottom") number is called the diastolic pressure. It is a measure of the pressure in your arteries as the heart relaxes. What are the causes? The cause of this condition is not known. What increases the risk? Some risk factors for high blood pressure are under your control. Others are not. Factors you can change  Smoking.  Having type 2 diabetes mellitus, high cholesterol, or both.  Not getting enough exercise or physical activity.  Being overweight.  Having too much fat, sugar, calories, or salt (sodium) in your diet.  Drinking too much alcohol. Factors that are difficult or impossible to change  Having chronic kidney disease.  Having a family history of high blood pressure.  Age. Risk increases with age.  Race. You may be at higher risk if you are African-American.  Gender. Men are at higher risk than women before age 61. After age 106, women are at higher risk than men.  Having obstructive sleep apnea.  Stress. What are the signs or symptoms? Extremely high blood pressure (hypertensive crisis) may cause:  Headache.  Anxiety.  Shortness of breath.  Nosebleed.  Nausea and vomiting.  Severe chest pain.  Jerky movements you cannot control (seizures).  How is this diagnosed? This condition is diagnosed by  measuring your blood pressure while you are seated, with your arm resting on a surface. The cuff of the blood pressure monitor will be placed directly against the skin of your upper arm at the level of your heart. It should be measured at least twice using the same arm. Certain conditions can cause a difference in blood pressure between your right and left arms. Certain factors can cause blood pressure readings to be lower or higher than normal (elevated) for a short period of time:  When your blood pressure is higher when you are in a health care provider's office than when you are at home, this is called white coat hypertension. Most people with this condition do not need medicines.  When your blood pressure is higher at home than when you are in a health care provider's office, this is called masked hypertension. Most people with this condition may need medicines to control blood pressure.  If you have a high blood pressure reading during one visit or you have normal blood pressure with other risk factors:  You may be asked to return on a different day to have your blood pressure checked again.  You may be asked to monitor your blood pressure at home for 1 week or longer.  If you are diagnosed with hypertension, you may have other blood or imaging tests to help your health care provider understand your overall risk for other conditions. How is this treated? This condition is treated by making healthy lifestyle changes, such as eating healthy foods, exercising more, and reducing your alcohol intake. Your health care provider may prescribe medicine if lifestyle changes are not enough to get your blood pressure under control, and if:  Your systolic blood pressure is above 130.  Your diastolic blood pressure is above 80.  Your personal target blood pressure may vary depending on your medical conditions, your age, and other factors. Follow these instructions at home: Eating and drinking  Eat a  diet that is high in fiber and potassium, and low in sodium, added sugar, and fat. An example eating plan is called the DASH (Dietary Approaches to Stop Hypertension) diet. To eat this way: ? Eat plenty of fresh fruits and vegetables. Try to fill half of your plate at each meal with fruits and vegetables. ? Eat whole grains, such as whole wheat pasta, brown rice, or whole grain bread. Fill about one quarter of your plate with whole grains. ? Eat or drink low-fat dairy products, such as skim milk or low-fat yogurt. ? Avoid fatty cuts of meat, processed or cured meats, and poultry with skin. Fill about one quarter of your plate with lean proteins, such as fish, chicken without skin, beans, eggs, and tofu. ? Avoid premade and processed foods. These tend to be higher in sodium, added sugar, and fat.  Reduce your daily sodium intake. Most people with hypertension should eat less than 1,500 mg of sodium a day.  Limit alcohol intake to no more than 1 drink a day for nonpregnant women and 2 drinks a day for men. One drink equals 12 oz of beer, 5 oz of wine, or 1 oz of hard liquor. Lifestyle  Work with your health care provider to maintain a healthy body weight or to lose weight. Ask what an ideal weight is for you.  Get at least 30 minutes of exercise that causes your heart to beat faster (aerobic exercise) most days of the week. Activities may include walking, swimming, or biking.  Include exercise to strengthen your muscles (resistance exercise), such as pilates or lifting weights, as part of your weekly exercise routine. Try to do these types of exercises for 30 minutes at least 3 days a week.  Do not use any products that contain nicotine or tobacco, such as cigarettes and e-cigarettes. If you need help quitting, ask your health care provider.  Monitor your blood pressure at home as told by your health care provider.  Keep all follow-up visits as told by your health care provider. This is  important. Medicines  Take over-the-counter and prescription medicines only as told by your health care provider. Follow directions carefully. Blood pressure medicines must be taken as prescribed.  Do not skip doses of blood pressure medicine. Doing this puts you at risk for problems and can make the medicine less effective.  Ask your health care provider about side effects or reactions to medicines that you should watch for. Contact a health care provider if:  You think you are having a reaction to a medicine you are taking.  You have headaches that keep coming back (recurring).  You feel dizzy.  You have swelling in your ankles.  You have trouble with your vision. Get help right away if:  You develop a severe headache or confusion.  You have unusual weakness or numbness.  You feel faint.  You have severe pain in your chest or abdomen.  You vomit repeatedly.  You have trouble breathing. Summary  Hypertension is when the force of blood pumping through your arteries is too strong. If this condition is not controlled, it may put you at risk for serious complications.  Your personal target blood pressure may vary depending on your medical  conditions, your age, and other factors. For most people, a normal blood pressure is less than 120/80.  Hypertension is treated with lifestyle changes, medicines, or a combination of both. Lifestyle changes include weight loss, eating a healthy, low-sodium diet, exercising more, and limiting alcohol. This information is not intended to replace advice given to you by your health care provider. Make sure you discuss any questions you have with your health care provider. Document Released: 09/22/2005 Document Revised: 08/20/2016 Document Reviewed: 08/20/2016 Elsevier Interactive Patient Education  2018 [employer].     IF you received an x-ray today, you will receive an invoice from [facility]. Please contact [facility]  at [phone] with questions or concerns regarding your invoice.   IF you received labwork today, you will receive an invoice from [facility]. Please contact [facility] at [phone] with questions or concerns regarding your invoice.   Our billing staff will not be able to assist you with questions regarding bills from these companies.  You will be contacted with the lab results as soon as they are available. The fastest way to get your results is to activate your My Chart account. Instructions are located on the last page of this paperwork. If you have not heard from [place] regarding the results in 2 weeks, please contact this office.

## 2017-04-16 ENCOUNTER — Ambulatory Visit: Payer: BC Managed Care – PPO | Admitting: Urgent Care

## 2017-04-17 ENCOUNTER — Telehealth: Payer: Self-pay | Admitting: Urgent Care

## 2017-04-17 NOTE — Telephone Encounter (Signed)
Please advise. Looking at your notes you wanted to start pt on a new medication and take her off the Wellbutrin.

## 2017-04-17 NOTE — Telephone Encounter (Signed)
Pt states that she is out of her Wellbutrin and she needs you to call in a Rx for a 75mg .  She is out of town and need it called into pharmacy:  [facility]  [address] [place] [address] [phone]    Pt contact number [phone]

## 2017-04-20 ENCOUNTER — Telehealth: Payer: Self-pay

## 2017-04-20 MED ORDER — BUPROPION HCL ER (XL) 150 MG PO TB24: 150.0000 mg | ORAL_TABLET | Freq: Every day | ORAL | 3 refills | Status: DC

## 2017-04-20 NOTE — Telephone Encounter (Signed)
Lets refill it for now. She has been traveling and we have been trying to switch her medications but she may be having a hard time with this. Refill sent in.

## 2017-04-20 NOTE — Telephone Encounter (Signed)
[facility] [place]

## 2017-05-18 ENCOUNTER — Other Ambulatory Visit: Payer: Self-pay | Admitting: Urgent Care

## 2017-06-16 ENCOUNTER — Telehealth: Payer: Self-pay | Admitting: Urgent Care
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
## 2017-06-18 ENCOUNTER — Ambulatory Visit: Payer: BC Managed Care – PPO | Admitting: Urgent Care

## 2017-06-23 MED ORDER — ALPRAZOLAM 0.25 MG PO TABS: ORAL_TABLET | ORAL | 0 refills | Status: DC

## 2017-06-23 NOTE — Telephone Encounter (Signed)
Prescription sent in.  Patient will need to make a follow up appointment with Dr. Nolon Rod.

## 2017-06-23 NOTE — Telephone Encounter (Signed)
Please phone in alprazolam.  Please help pt schedule a follow up

## 2017-06-23 NOTE — Telephone Encounter (Signed)
Please call pt and schedule. Thank you!

## 2017-08-05 ENCOUNTER — Encounter: Payer: Self-pay | Admitting: Emergency Medicine

## 2017-08-05 ENCOUNTER — Ambulatory Visit (INDEPENDENT_AMBULATORY_CARE_PROVIDER_SITE_OTHER): Payer: BC Managed Care – PPO | Admitting: Emergency Medicine

## 2017-08-05 VITALS — BP 110/80 | HR 81 | Temp 98.9°F | Resp 16 | Ht 70.0 in | Wt 302.4 lb

## 2017-08-05 DIAGNOSIS — J22 Unspecified acute lower respiratory infection: Secondary | ICD-10-CM | POA: Diagnosis not present

## 2017-08-05 DIAGNOSIS — R05 Cough: Secondary | ICD-10-CM

## 2017-08-05 DIAGNOSIS — D170 Benign lipomatous neoplasm of skin and subcutaneous tissue of head, face and neck: Secondary | ICD-10-CM | POA: Diagnosis not present

## 2017-08-05 DIAGNOSIS — R509 Fever, unspecified: Secondary | ICD-10-CM | POA: Insufficient documentation

## 2017-08-05 DIAGNOSIS — R059 Cough, unspecified: Secondary | ICD-10-CM

## 2017-08-05 MED ORDER — AZITHROMYCIN 250 MG PO TABS: ORAL_TABLET | ORAL | 0 refills | Status: DC

## 2017-08-05 MED ORDER — PREDNISONE 20 MG PO TABS: 20.0000 mg | ORAL_TABLET | Freq: Every day | ORAL | 0 refills | Status: AC

## 2017-08-05 NOTE — Patient Instructions (Addendum)
     IF you received an x-ray today, you will receive an invoice from [facility]. Please contact [facility] at [phone] with questions or concerns regarding your invoice.   IF you received labwork today, you will receive an invoice from [facility]. Please contact [facility] at [phone] with questions or concerns regarding your invoice.   Our billing staff will not be able to assist you with questions regarding bills from these companies.  You will be contacted with the lab results as soon as they are available. The fastest way to get your results is to activate your My Chart account. Instructions are located on the last page of this paperwork. If you have not heard from us regarding the results in 2 weeks, please contact this office.      Acute Bronchitis, Adult Acute bronchitis is when air tubes (bronchi) in the lungs suddenly get swollen. The condition can make it hard to breathe. It can also cause these symptoms:  A cough.  Coughing up clear, yellow, or green mucus.  Wheezing.  Chest congestion.  Shortness of breath.  A fever.  Body aches.  Chills.  A sore throat.  Follow these instructions at home: Medicines  Take over-the-counter and prescription medicines only as told by your doctor.  If you were prescribed an antibiotic medicine, take it as told by your doctor. Do not stop taking the antibiotic even if you start to feel better. General instructions  Rest.  Drink enough fluids to keep your pee (urine) clear or pale yellow.  Avoid smoking and secondhand smoke. If you smoke and you need help quitting, ask your doctor. Quitting will help your lungs heal faster.  Use an inhaler, cool mist vaporizer, or humidifier as told by your doctor.  Keep all follow-up visits as told by your doctor. This is important. How is this prevented? To lower your risk of getting this condition again:  Wash your hands often with soap and water. If you cannot  use soap and water, use hand sanitizer.  Avoid contact with people who have cold symptoms.  Try not to touch your hands to your mouth, nose, or eyes.  Make sure to get the flu shot every year.  Contact a doctor if:  Your symptoms do not get better in 2 weeks. Get help right away if:  You cough up blood.  You have chest pain.  You have very bad shortness of breath.  You become dehydrated.  You faint (pass out) or keep feeling like you are going to pass out.  You keep throwing up (vomiting).  You have a very bad headache.  Your fever or chills gets worse. This information is not intended to replace advice given to you by your health care provider. Make sure you discuss any questions you have with your health care provider. Document Released: 03/10/2008 Document Revised: 04/30/2016 Document Reviewed: 03/12/2016 Elsevier Interactive Patient Education  2017 Elsevier Inc.  

## 2017-08-05 NOTE — Progress Notes (Addendum)
Victoria Ewing 41 y.o.   Chief Complaint  Patient presents with  . Cough    PRODUCTIVE - YELLOW, GREEN and BROWN mucus x 2 weeks  . Sore Throat    with fever    HISTORY OF PRESENT ILLNESS: This is a 41 y.o. female complaining of cough and fever x 4-5 days.  Cough  This is a new problem. The current episode started in the past 7 days. The problem has been gradually worsening. The problem occurs constantly. The cough is productive of sputum. Associated symptoms include a fever, a sore throat and wheezing. Pertinent negatives include no chest pain, chills, eye redness, headaches, hemoptysis, myalgias, nasal congestion or rash. She has tried a beta-agonist inhaler for the symptoms. Her past medical history is significant for asthma.     Prior to Admission medications   Medication Sig Start Date End Date Taking? Authorizing Provider  albuterol (PROVENTIL HFA;VENTOLIN HFA) 108 (90 Base) MCG/ACT inhaler Inhale 2 puffs into the lungs every 6 (six) hours as needed for wheezing. 11/05/16  Yes Stallings, Zoe A, MD  ALPRAZolam (XANAX) 0.25 MG tablet TAKE 1 TO 1 & 1/2 TABLETS ONCE DAILY AS NEEDED 06/23/17  Yes Stallings, Zoe A, MD  cholecalciferol (VITAMIN D) 1000 UNITS tablet Take 2,000 Units by mouth daily. Reported on 02/08/2016   Yes [provider]  fluticasone (FLONASE) 50 MCG/ACT nasal spray Place 2 sprays into both nostrils daily. 12/31/16  Yes Stallings, Zoe A, MD  hydrocortisone 2.5 % cream Apply topically 2 (two) times daily. 08/11/16  Yes Stallings, Zoe A, MD  loratadine (CLARITIN) 10 MG tablet Take 10 mg by mouth daily.   Yes [provider]  meclizine (ANTIVERT) 25 MG tablet Take 1 tablet (25 mg total) by mouth 3 (three) times daily as needed for dizziness. 03/05/17  Yes Jeffery, Chelle, PA-C  triamcinolone ointment (KENALOG) 0.1 % Apply 1 application topically 2 (two) times daily. 12/19/15  Yes Jaynee Eagles, PA-C  amLODipine (NORVASC) 5 MG tablet Take 1 tablet (5 mg  total) by mouth daily. Patient not taking: Reported on 08/05/2017 03/16/17   Jaynee Eagles, PA-C  buPROPion (WELLBUTRIN XL) 150 MG 24 hr tablet Take 1 tablet (150 mg total) by mouth daily. Patient not taking: Reported on 08/05/2017 04/20/17   Jaynee Eagles, PA-C  naproxen (NAPROSYN) 500 MG tablet Take 1 tablet (500 mg total) by mouth 2 (two) times daily with a meal. Patient not taking: Reported on 08/05/2017 02/24/17   Forrest Moron, MD  venlafaxine XR (EFFEXOR-XR) 75 MG 24 hr capsule Take 1 capsule (75 mg total) by mouth daily with breakfast. Patient not taking: Reported on 08/05/2017 04/15/17   Jaynee Eagles, PA-C    Allergies  Allergen Reactions  . Other Anaphylaxis    Pecans and nuts     Patient Active Problem List   Diagnosis Date Noted  . BMI 40.0-44.9, adult ([place]) 03/05/2017  . Eczema 12/14/2014  . Asthma, chronic 08/30/2014  . Anxiety state 07/22/2013  . Allergic rhinitis 07/22/2013    Past Medical History:  Diagnosis Date  . Allergy   . Anxiety   . Asthma   . Depression     Past Surgical History:  Procedure Laterality Date  . CESAREAN SECTION      Social History   Social History  . Marital status: Single    Spouse name: N/A  . Number of children: N/A  . Years of education: N/A   Occupational History  . Not on file.  Social History Main Topics  . Smoking status: Never Smoker  . Smokeless tobacco: Never Used  . Alcohol use 0.0 oz/week  . Drug use: No  . Sexual activity: Not Currently   Other Topics Concern  . Not on file   Social History Narrative  . No narrative on file    Family History  Problem Relation Age of Onset  . Cancer Mother        pancreatic  . Stroke Mother 66  . Diabetes Mother   . Kidney disease Mother        transplant in 2010  . Hypertension Mother   . Transient ischemic attack Father 16  . Cancer Father        prostate cancer?  . Stroke Maternal Grandmother 38  . Hypertension Sister 13  . Stroke Cousin 45  . Rheum  arthritis Cousin      Review of Systems  Constitutional: Positive for fever. Negative for chills.  HENT: Positive for sore throat. Negative for congestion and nosebleeds.   Eyes: Negative.  Negative for discharge and redness.  Respiratory: Positive for wheezing. Negative for hemoptysis.   Cardiovascular: Negative for chest pain.  Gastrointestinal: Negative for abdominal pain, diarrhea, nausea and vomiting.  Genitourinary: Negative for dysuria and hematuria.  Musculoskeletal: Negative for myalgias.  Skin: Negative for rash.  Neurological: Negative.  Negative for dizziness and headaches.  Endo/Heme/Allergies: Negative.   All other systems reviewed and are negative.   Vitals:   08/05/17 1436  BP: 110/80  Pulse: 81  Resp: 16  Temp: 98.9 F (37.2 C)  SpO2: 99%    Physical Exam  Constitutional: She is oriented to person, place, and time. She appears well-developed and well-nourished.  HENT:  Head: Normocephalic and atraumatic.  Right Ear: External ear normal.  Left Ear: External ear normal.  Nose: Nose normal.  Mouth/Throat: Oropharynx is clear and moist.  Eyes: Pupils are equal, round, and reactive to light. Conjunctivae and EOM are normal.  Neck: Normal range of motion. Neck supple. No JVD present.  Cardiovascular: Normal rate, regular rhythm, normal heart sounds and intact distal pulses.   Pulmonary/Chest: Effort normal and breath sounds normal.  Abdominal: Soft. Bowel sounds are normal. She exhibits no distension. There is no tenderness.  Musculoskeletal: Normal range of motion.  Lymphadenopathy:    She has no cervical adenopathy.  Neurological: She is alert and oriented to person, place, and time. No sensory deficit. She exhibits normal muscle tone.  Skin: Skin is warm and dry. Capillary refill takes less than 2 seconds. No rash noted.  +lipoma to back of neck  Psychiatric: She has a normal mood and affect. Her behavior is normal.  Vitals reviewed.    ASSESSMENT &  PLAN: Latrelle was seen today for cough and sore throat.  Diagnoses and all orders for this visit:  Lower respiratory infection  Cough  Fever, unspecified fever cause  Lipoma of neck -     Ambulatory referral to General Surgery  Other orders -     azithromycin (ZITHROMAX) 250 MG tablet; Sig as indicated -     predniSONE (DELTASONE) 20 MG tablet; Take 1 tablet (20 mg total) by mouth daily with breakfast.     Patient Instructions       IF you received an x-ray today, you will receive an invoice from [facility]. Please contact [facility] at [phone] with questions or concerns regarding your invoice.   IF you received labwork today, you will receive an invoice from  [facility]. Please contact [facility] at [phone] with questions or concerns regarding your invoice.   Our billing staff will not be able to assist you with questions regarding bills from these companies.  You will be contacted with the lab results as soon as they are available. The fastest way to get your results is to activate your My Chart account. Instructions are located on the last page of this paperwork. If you have not heard from [place] regarding the results in 2 weeks, please contact this office.     Acute Bronchitis, Adult Acute bronchitis is when air tubes (bronchi) in the lungs suddenly get swollen. The condition can make it hard to breathe. It can also cause these symptoms:  A cough.  Coughing up clear, yellow, or green mucus.  Wheezing.  Chest congestion.  Shortness of breath.  A fever.  Body aches.  Chills.  A sore throat.  Follow these instructions at home: Medicines  Take over-the-counter and prescription medicines only as told by your doctor.  If you were prescribed an antibiotic medicine, take it as told by your doctor. Do not stop taking the antibiotic even if you start to feel better. General instructions  Rest.  Drink enough fluids to keep your pee  (urine) clear or pale yellow.  Avoid smoking and secondhand smoke. If you smoke and you need help quitting, ask your doctor. Quitting will help your lungs heal faster.  Use an inhaler, cool mist vaporizer, or humidifier as told by your doctor.  Keep all follow-up visits as told by your doctor. This is important. How is this prevented? To lower your risk of getting this condition again:  Wash your hands often with soap and water. If you cannot use soap and water, use hand sanitizer.  Avoid contact with people who have cold symptoms.  Try not to touch your hands to your mouth, nose, or eyes.  Make sure to get the flu shot every year.  Contact a doctor if:  Your symptoms do not get better in 2 weeks. Get help right away if:  You cough up blood.  You have chest pain.  You have very bad shortness of breath.  You become dehydrated.  You faint (pass out) or keep feeling like you are going to pass out.  You keep throwing up (vomiting).  You have a very bad headache.  Your fever or chills gets worse. This information is not intended to replace advice given to you by your health care provider. Make sure you discuss any questions you have with your health care provider. Document Released: 03/10/2008 Document Revised: 04/30/2016 Document Reviewed: 03/12/2016 Elsevier Interactive Patient Education  2017 Elsevier Inc.      Agustina Caroli, MD [facility]

## 2017-08-05 NOTE — Addendum Note (Signed)
Addended by: Davina Poke on: 08/05/2017 03:23 PM   Modules accepted: Orders

## 2017-08-19 ENCOUNTER — Other Ambulatory Visit: Payer: Self-pay | Admitting: Family Medicine

## 2017-08-19 NOTE — Telephone Encounter (Signed)
Requesting  A  Refill  Of  Xanax

## 2017-08-26 ENCOUNTER — Ambulatory Visit: Payer: BC Managed Care – PPO | Admitting: Urgent Care

## 2017-08-26 ENCOUNTER — Encounter: Payer: Self-pay | Admitting: Urgent Care

## 2017-08-26 VITALS — BP 150/90 | HR 89 | Temp 98.8°F | Resp 16 | Ht 70.0 in | Wt 306.0 lb

## 2017-08-26 DIAGNOSIS — I10 Essential (primary) hypertension: Secondary | ICD-10-CM | POA: Diagnosis not present

## 2017-08-26 DIAGNOSIS — Z9109 Other allergy status, other than to drugs and biological substances: Secondary | ICD-10-CM

## 2017-08-26 DIAGNOSIS — F419 Anxiety disorder, unspecified: Secondary | ICD-10-CM

## 2017-08-26 MED ORDER — ALPRAZOLAM 0.25 MG PO TABS: ORAL_TABLET | ORAL | 0 refills | Status: DC

## 2017-08-26 MED ORDER — SERTRALINE HCL 50 MG PO TABS: 50.0000 mg | ORAL_TABLET | Freq: Every day | ORAL | 3 refills | Status: DC

## 2017-08-26 NOTE — Progress Notes (Signed)
    MRN: [id number] DOB: 30-Jan-1976  Subjective:   Victoria Ewing is a 41 y.o. female presenting for follow up on anxiety, high blood pressure. She was previously using Wellbutrin for this in 04/2017 and the months prior to that. She was having issues still with blood pressure and anxiety. We switched her off of that to and she stopped taking it approximately 2 months ago. She start Effexor 2 days ago and had intolerable side effects including worsening anxiety, agitation, heart racing. She stopped taking this yesterday. Has been using Xanax 0.25mg  about once daily. She does admit that she is doing better since leaving her very stressful job. Is also planning on starting therapy. Admits that she is handling her mother's passing better. Denies HI, SI. Understands need for maintenance therapy in addition to benzo. Has previously failed Lexapro, Celexa. Cannot recall effects of Zoloft. Patient has a surgery scheduled for removal of a lipoma on 10/02/2017. Regarding BP, denies headaches, dizziness, chest pain, abdominal pain, hematuria, lower leg swelling. She is taking pseudoephedrine with Allegra for allergies which she has year round.  Victoria Ewing has a current medication list which includes the following prescription(s): albuterol, alprazolam, amlodipine, cholecalciferol, fluticasone, loratadine, meclizine, naproxen, triamcinolone ointment, and venlafaxine xr. Also is allergic to other.  Victoria Ewing  has a past medical history of Allergy, Anxiety, Asthma, and Depression. Also  has a past surgical history that includes Cesarean section.  Objective:   Vitals: BP (!) 150/90   Pulse 89   Temp 98.8 F (37.1 C) (Oral)   Resp 16   Ht 5\' 10"  (1.778 m)   Wt (!) 306 lb (138.8 kg)   LMP 08/03/2017   SpO2 99%   BMI 43.91 kg/m   Physical Exam  Constitutional: She is oriented to person, place, and time. She appears well-developed and well-nourished.  HENT:  Mouth/Throat: Oropharynx is clear and moist.    Eyes: No scleral icterus.  Neck: Normal range of motion. Neck supple. No thyromegaly present.  Cardiovascular: Normal rate, regular rhythm and intact distal pulses. Exam reveals no gallop and no friction rub.  No murmur heard. Pulmonary/Chest: No respiratory distress. She has no wheezes. She has no rales.  Neurological: She is alert and oriented to person, place, and time.  Skin: Skin is warm and dry.  Psychiatric:  Anxious demeanor. Not suicidal or homicidal.   Assessment and Plan :   1. Anxiety - Start Zoloft at 50mg . Recheck in 4 weeks. Return-to-clinic precautions discussed, patient verbalized understanding.   2. Essential hypertension - Hold off on BP medications for now. F/u as above.  3. Environmental allergies - Hold pseudoephedrine. Maintain Allegra.   Jaynee Eagles, PA-C [facility] [phone] 08/26/2017  10:55 AM

## 2017-08-26 NOTE — Patient Instructions (Addendum)
[facility] [address] [place], [place] [address]   Burnard Leigh [phone]  Horton Finer [phone]  Everardo Beals [phone]   [facility] ([place], [place], [place]) - [phone]  [facility]) - [facility] - [phone]  [facility] [place] - [phone]  [facility]  - [phone] (do not file insurance)    Sertraline tablets What is this medicine? SERTRALINE (SER tra leen) is used to treat depression. It may also be used to treat obsessive compulsive disorder, panic disorder, post-trauma stress, premenstrual dysphoric disorder (PMDD) or social anxiety. This medicine may be used for other purposes; ask your health care provider or pharmacist if you have questions. COMMON BRAND NAME(S): Zoloft What should I tell my health care provider before I take this medicine? They need to know if you have any of these conditions: -bleeding disorders -bipolar disorder or a family history of bipolar disorder -glaucoma -heart disease -high blood pressure -history of irregular heartbeat -history of low levels of calcium, magnesium, or potassium in the blood -if you often drink alcohol -liver disease -receiving electroconvulsive therapy -seizures -suicidal thoughts, plans, or attempt; a previous suicide attempt by you or a family member -take medicines that treat or prevent blood clots -thyroid disease -an unusual or allergic reaction to sertraline, other medicines, foods, dyes, or preservatives -pregnant or trying to get pregnant -breast-feeding How should I use this medicine? Take this medicine by mouth with a glass of water. Follow the directions on the prescription label. You can take it with or without food. Take your medicine at regular intervals. Do not  take your medicine more often than directed. Do not stop taking this medicine suddenly except upon the advice of your doctor. Stopping this medicine too quickly may cause serious side effects or your condition may worsen. A special MedGuide will be given to you by the pharmacist with each prescription and refill. Be sure to read this information carefully each time. Talk to your pediatrician regarding the use of this medicine in children. While this drug may be prescribed for children as young as 7 years for selected conditions, precautions do apply. Overdosage: If you think you have taken too much of this medicine contact a poison control center or emergency room at once. NOTE: This medicine is only for you. Do not share this medicine with others. What if I miss a dose? If you miss a dose, take it as soon as you can. If it is almost time for your next dose, take only that dose. Do not take double or extra doses. What may interact with this medicine? Do not take this medicine with any of the following medications: -cisapride -dofetilide -dronedarone -linezolid -MAOIs like Carbex, Eldepryl, Marplan, Nardil, and Parnate -methylene blue (injected into a vein) -pimozide -thioridazine This medicine may also interact with the following medications: -alcohol -amphetamines -aspirin and aspirin-like medicines -certain medicines for depression, anxiety, or psychotic disturbances -certain medicines for fungal infections like ketoconazole, fluconazole, posaconazole, and itraconazole -certain medicines for irregular heart beat like flecainide, quinidine, propafenone -certain medicines for migraine headaches like almotriptan, eletriptan, frovatriptan, naratriptan, rizatriptan, sumatriptan, zolmitriptan -certain medicines for sleep -certain medicines for seizures like carbamazepine, valproic acid, phenytoin -certain medicines that treat or prevent blood clots like warfarin, enoxaparin,  dalteparin -cimetidine -digoxin -diuretics -fentanyl -isoniazid -lithium -NSAIDs, medicines for pain and inflammation, like ibuprofen or naproxen -other medicines that prolong the QT  interval (cause an abnormal heart rhythm) -rasagiline -safinamide -supplements like St. John's wort, kava kava, valerian -tolbutamide -tramadol -tryptophan This list may not describe all possible interactions. Give your health care provider a list of all the medicines, herbs, non-prescription drugs, or dietary supplements you use. Also tell them if you smoke, drink alcohol, or use illegal drugs. Some items may interact with your medicine. What should I watch for while using this medicine? Tell your doctor if your symptoms do not get better or if they get worse. Visit your doctor or health care professional for regular checks on your progress. Because it may take several weeks to see the full effects of this medicine, it is important to continue your treatment as prescribed by your doctor. Patients and their families should watch out for new or worsening thoughts of suicide or depression. Also watch out for sudden changes in feelings such as feeling anxious, agitated, panicky, irritable, hostile, aggressive, impulsive, severely restless, overly excited and hyperactive, or not being able to sleep. If this happens, especially at the beginning of treatment or after a change in dose, call your health care professional. Dennis Bast may get drowsy or dizzy. Do not drive, use machinery, or do anything that needs mental alertness until you know how this medicine affects you. Do not stand or sit up quickly, especially if you are an older patient. This reduces the risk of dizzy or fainting spells. Alcohol may interfere with the effect of this medicine. Avoid alcoholic drinks. Your mouth may get dry. Chewing sugarless gum or sucking hard candy, and drinking plenty of water may help. Contact your doctor if the problem does not go away or  is severe. What side effects may I notice from receiving this medicine? Side effects that you should report to your doctor or health care professional as soon as possible: -allergic reactions like skin rash, itching or hives, swelling of the face, lips, or tongue -anxious -black, tarry stools -changes in vision -confusion -elevated mood, decreased need for sleep, racing thoughts, impulsive behavior -eye pain -fast, irregular heartbeat -feeling faint or lightheaded, falls -feeling agitated, angry, or irritable -hallucination, loss of contact with reality -loss of balance or coordination -loss of memory -painful or prolonged erections -restlessness, pacing, inability to keep still -seizures -stiff muscles -suicidal thoughts or other mood changes -trouble sleeping -unusual bleeding or bruising -unusually weak or tired -vomiting Side effects that usually do not require medical attention (report to your doctor or health care professional if they continue or are bothersome): -change in appetite or weight -change in sex drive or performance -diarrhea -increased sweating -indigestion, nausea -tremors This list may not describe all possible side effects. Call your doctor for medical advice about side effects. You may report side effects to FDA at 1-800-FDA-1088. Where should I keep my medicine? Keep out of the reach of children. Store at room temperature between 15 and 30 degrees C (59 and 86 degrees F). Throw away any unused medicine after the expiration date. NOTE: This sheet is a summary. It may not cover all possible information. If you have questions about this medicine, talk to your doctor, pharmacist, or health care provider.  2018 Elsevier/Gold Standard (2016-09-26 14:17:49)     IF you received an x-ray today, you will receive an invoice from [facility]. Please contact [facility] at [phone] with questions or concerns regarding your invoice.   IF  you received labwork today, you will receive an invoice from [facility]. Please contact [facility] at [phone] with questions or  concerns regarding your invoice.   Our billing staff will not be able to assist you with questions regarding bills from these companies.  You will be contacted with the lab results as soon as they are available. The fastest way to get your results is to activate your My Chart account. Instructions are located on the last page of this paperwork. If you have not heard from [place] regarding the results in 2 weeks, please contact this office.

## 2017-08-31 NOTE — Telephone Encounter (Signed)
Refilled 08/26/17

## 2017-09-24 ENCOUNTER — Ambulatory Visit: Payer: BC Managed Care – PPO | Admitting: Urgent Care

## 2017-09-30 ENCOUNTER — Ambulatory Visit: Payer: BC Managed Care – PPO | Admitting: Urgent Care

## 2017-10-02 NOTE — Telephone Encounter (Signed)
Error-Sign Encounter 

## 2017-10-05 ENCOUNTER — Ambulatory Visit: Payer: BC Managed Care – PPO | Admitting: Urgent Care

## 2017-10-05 VITALS — BP 140/90 | HR 89 | Temp 98.0°F | Resp 18 | Ht 70.0 in | Wt 303.0 lb

## 2017-10-05 DIAGNOSIS — F419 Anxiety disorder, unspecified: Secondary | ICD-10-CM | POA: Diagnosis not present

## 2017-10-05 DIAGNOSIS — R030 Elevated blood-pressure reading, without diagnosis of hypertension: Secondary | ICD-10-CM | POA: Diagnosis not present

## 2017-10-05 DIAGNOSIS — G4700 Insomnia, unspecified: Secondary | ICD-10-CM

## 2017-10-05 DIAGNOSIS — I10 Essential (primary) hypertension: Secondary | ICD-10-CM

## 2017-10-05 MED ORDER — ALPRAZOLAM 0.25 MG PO TABS: ORAL_TABLET | ORAL | 0 refills | Status: DC

## 2017-10-05 NOTE — Progress Notes (Signed)
   MRN: [id number] DOB: 1975/11/28  Subjective:   Victoria Ewing is a 41 y.o. female presenting for follow up on anxiety and HTN. Reports that she is not being consistent with Zoloft. She is using Xanax 1/2-1 tablet daily, has had GI side effects with it. Admits that her stressors have gotten better especially with dealing with her child's father, coping with the loss of her mother and school. She did not have as much stress over the holidays as well. Has a great relationship with her daughter. Denies SI, HI. Denies dizziness, chronic headache, chest pain, shortness of breath, heart racing, palpitations, nausea, vomiting, abdominal pain, hematuria, lower leg swelling.  Reports that she is planning on setting up behavioral therapy soon.   Victoria Ewing has a current medication list which includes the following prescription(s): albuterol, alprazolam, cholecalciferol, fluticasone, loratadine, sertraline, and triamcinolone ointment. Also is allergic to other and effexor xr [venlafaxine hcl er].  Victoria Ewing  has a past medical history of Allergy, Anxiety, Asthma, and Depression. Also  has a past surgical history that includes Cesarean section.  Objective:   Vitals: BP 140/90   Pulse 89   Temp 98 F (36.7 C) (Oral)   Resp 18   Ht 5\' 10"  (1.778 m)   Wt (!) 303 lb (137.4 kg)   SpO2 98%   BMI 43.48 kg/m   BP Readings from Last 3 Encounters:  10/05/17 140/90  08/26/17 (!) 150/90  08/05/17 110/80   Physical Exam  Constitutional: She is oriented to person, place, and time. She appears well-developed and well-nourished.  Cardiovascular: Normal rate.  Pulmonary/Chest: Effort normal.  Neurological: She is alert and oriented to person, place, and time.  Psychiatric: She has a normal mood and affect.   Assessment and Plan :   Anxiety  Insomnia, unspecified type  Essential hypertension  Elevated blood pressure reading   Patient is not agreeable to taking amlodipine for her blood pressure. She  is going to try to be more consistent with Zoloft. Offered refill for Xanax. Will revisit her SSRI therapy after she starts therapy, consider starting Prozac. Okay to refill Xanax one more time between now and 3 months.  Jaynee Eagles, PA-C [facility] [phone] 10/05/2017  5:47 PM

## 2017-10-05 NOTE — Patient Instructions (Addendum)
Sertraline tablets What is this medicine? SERTRALINE (SER tra leen) is used to treat depression. It may also be used to treat obsessive compulsive disorder, panic disorder, post-trauma stress, premenstrual dysphoric disorder (PMDD) or social anxiety. This medicine may be used for other purposes; ask your health care provider or pharmacist if you have questions. COMMON BRAND NAME(S): Zoloft What should I tell my health care provider before I take this medicine? They need to know if you have any of these conditions: -bleeding disorders -bipolar disorder or a family history of bipolar disorder -glaucoma -heart disease -high blood pressure -history of irregular heartbeat -history of low levels of calcium, magnesium, or potassium in the blood -if you often drink alcohol -liver disease -receiving electroconvulsive therapy -seizures -suicidal thoughts, plans, or attempt; a previous suicide attempt by you or a family member -take medicines that treat or prevent blood clots -thyroid disease -an unusual or allergic reaction to sertraline, other medicines, foods, dyes, or preservatives -pregnant or trying to get pregnant -breast-feeding How should I use this medicine? Take this medicine by mouth with a glass of water. Follow the directions on the prescription label. You can take it with or without food. Take your medicine at regular intervals. Do not take your medicine more often than directed. Do not stop taking this medicine suddenly except upon the advice of your doctor. Stopping this medicine too quickly may cause serious side effects or your condition may worsen. A special MedGuide will be given to you by the pharmacist with each prescription and refill. Be sure to read this information carefully each time. Talk to your pediatrician regarding the use of this medicine in children. While this drug may be prescribed for children as young as 7 years for selected conditions, precautions do  apply. Overdosage: If you think you have taken too much of this medicine contact a poison control center or emergency room at once. NOTE: This medicine is only for you. Do not share this medicine with others. What if I miss a dose? If you miss a dose, take it as soon as you can. If it is almost time for your next dose, take only that dose. Do not take double or extra doses. What may interact with this medicine? Do not take this medicine with any of the following medications: -cisapride -dofetilide -dronedarone -linezolid -MAOIs like Carbex, Eldepryl, Marplan, Nardil, and Parnate -methylene blue (injected into a vein) -pimozide -thioridazine This medicine may also interact with the following medications: -alcohol -amphetamines -aspirin and aspirin-like medicines -certain medicines for depression, anxiety, or psychotic disturbances -certain medicines for fungal infections like ketoconazole, fluconazole, posaconazole, and itraconazole -certain medicines for irregular heart beat like flecainide, quinidine, propafenone -certain medicines for migraine headaches like almotriptan, eletriptan, frovatriptan, naratriptan, rizatriptan, sumatriptan, zolmitriptan -certain medicines for sleep -certain medicines for seizures like carbamazepine, valproic acid, phenytoin -certain medicines that treat or prevent blood clots like warfarin, enoxaparin, dalteparin -cimetidine -digoxin -diuretics -fentanyl -isoniazid -lithium -NSAIDs, medicines for pain and inflammation, like ibuprofen or naproxen -other medicines that prolong the QT interval (cause an abnormal heart rhythm) -rasagiline -safinamide -supplements like St. John's wort, kava kava, valerian -tolbutamide -tramadol -tryptophan This list may not describe all possible interactions. Give your health care provider a list of all the medicines, herbs, non-prescription drugs, or dietary supplements you use. Also tell them if you smoke, drink  alcohol, or use illegal drugs. Some items may interact with your medicine. What should I watch for while using this medicine? Tell your doctor if your symptoms   do not get better or if they get worse. Visit your doctor or health care professional for regular checks on your progress. Because it may take several weeks to see the full effects of this medicine, it is important to continue your treatment as prescribed by your doctor. Patients and their families should watch out for new or worsening thoughts of suicide or depression. Also watch out for sudden changes in feelings such as feeling anxious, agitated, panicky, irritable, hostile, aggressive, impulsive, severely restless, overly excited and hyperactive, or not being able to sleep. If this happens, especially at the beginning of treatment or after a change in dose, call your health care professional. Dennis Bast may get drowsy or dizzy. Do not drive, use machinery, or do anything that needs mental alertness until you know how this medicine affects you. Do not stand or sit up quickly, especially if you are an older patient. This reduces the risk of dizzy or fainting spells. Alcohol may interfere with the effect of this medicine. Avoid alcoholic drinks. Your mouth may get dry. Chewing sugarless gum or sucking hard candy, and drinking plenty of water may help. Contact your doctor if the problem does not go away or is severe. What side effects may I notice from receiving this medicine? Side effects that you should report to your doctor or health care professional as soon as possible: -allergic reactions like skin rash, itching or hives, swelling of the face, lips, or tongue -anxious -black, tarry stools -changes in vision -confusion -elevated mood, decreased need for sleep, racing thoughts, impulsive behavior -eye pain -fast, irregular heartbeat -feeling faint or lightheaded, falls -feeling agitated, angry, or irritable -hallucination, loss of contact with  reality -loss of balance or coordination -loss of memory -painful or prolonged erections -restlessness, pacing, inability to keep still -seizures -stiff muscles -suicidal thoughts or other mood changes -trouble sleeping -unusual bleeding or bruising -unusually weak or tired -vomiting Side effects that usually do not require medical attention (report to your doctor or health care professional if they continue or are bothersome): -change in appetite or weight -change in sex drive or performance -diarrhea -increased sweating -indigestion, nausea -tremors This list may not describe all possible side effects. Call your doctor for medical advice about side effects. You may report side effects to FDA at 1-800-FDA-1088. Where should I keep my medicine? Keep out of the reach of children. Store at room temperature between 15 and 30 degrees C (59 and 86 degrees F). Throw away any unused medicine after the expiration date. NOTE: This sheet is a summary. It may not cover all possible information. If you have questions about this medicine, talk to your doctor, pharmacist, or health care provider.  2018 Elsevier/Gold Standard (2016-09-26 14:17:49)   Alprazolam tablets What is this medicine? ALPRAZOLAM (al PRAY zoe lam) is a benzodiazepine. It is used to treat anxiety and panic attacks. This medicine may be used for other purposes; ask your health care provider or pharmacist if you have questions. COMMON BRAND NAME(S): Xanax What should I tell my health care provider before I take this medicine? They need to know if you have any of these conditions: -an alcohol or drug abuse problem -bipolar disorder, depression, psychosis or other mental health conditions -glaucoma -kidney or liver disease -lung or breathing disease -myasthenia gravis -Parkinson's disease -porphyria -seizures or a history of seizures -suicidal thoughts -an unusual or allergic reaction to alprazolam, other  benzodiazepines, foods, dyes, or preservatives -pregnant or trying to get pregnant -breast-feeding How should I use  this medicine? Take this medicine by mouth with a glass of water. Follow the directions on the prescription label. Take your medicine at regular intervals. Do not take it more often than directed. Do not stop taking except on your doctor's advice. A special MedGuide will be given to you by the pharmacist with each prescription and refill. Be sure to read this information carefully each time. Talk to your pediatrician regarding the use of this medicine in children. Special care may be needed. Overdosage: If you think you have taken too much of this medicine contact a poison control center or emergency room at once. NOTE: This medicine is only for you. Do not share this medicine with others. What if I miss a dose? If you miss a dose, take it as soon as you can. If it is almost time for your next dose, take only that dose. Do not take double or extra doses. What may interact with this medicine? Do not take this medicine with any of the following medications: -certain antiviral medicines for HIV or AIDS like delavirdine, indinavir -certain medicines for fungal infections like ketoconazole and itraconazole -narcotic medicines for cough -sodium oxybate This medicine may also interact with the following medications: -alcohol -antihistamines for allergy, cough and cold -certain antibiotics like clarithromycin, erythromycin, isoniazid, rifampin, rifapentine, rifabutin, and troleandomycin -certain medicines for blood pressure, heart disease, irregular heart beat -certain medicines for depression, like amitriptyline, fluoxetine, sertraline -certain medicines for seizures like carbamazepine, oxcarbazepine, phenobarbital, phenytoin, primidone -cimetidine -cyclosporine -female hormones, like estrogens or progestins and birth control pills, patches, rings, or injections -general anesthetics  like halothane, isoflurane, methoxyflurane, propofol -grapefruit juice -local anesthetics like lidocaine, pramoxine, tetracaine -medicines that relax muscles for surgery -narcotic medicines for pain -other antiviral medicines for HIV or AIDS -phenothiazines like chlorpromazine, mesoridazine, prochlorperazine, thioridazine This list may not describe all possible interactions. Give your health care provider a list of all the medicines, herbs, non-prescription drugs, or dietary supplements you use. Also tell them if you smoke, drink alcohol, or use illegal drugs. Some items may interact with your medicine. What should I watch for while using this medicine? Tell your doctor or health care professional if your symptoms do not start to get better or if they get worse. Do not stop taking except on your doctor's advice. You may develop a severe reaction. Your doctor will tell you how much medicine to take. You may get drowsy or dizzy. Do not drive, use machinery, or do anything that needs mental alertness until you know how this medicine affects you. To reduce the risk of dizzy and fainting spells, do not stand or sit up quickly, especially if you are an older patient. Alcohol may increase dizziness and drowsiness. Avoid alcoholic drinks. If you are taking another medicine that also causes drowsiness, you may have more side effects. Give your health care provider a list of all medicines you use. Your doctor will tell you how much medicine to take. Do not take more medicine than directed. Call emergency for help if you have problems breathing or unusual sleepiness. What side effects may I notice from receiving this medicine? Side effects that you should report to your doctor or health care professional as soon as possible: -allergic reactions like skin rash, itching or hives, swelling of the face, lips, or tongue -breathing problems -confusion -loss of balance or coordination -signs and symptoms of low  blood pressure like dizziness; feeling faint or lightheaded, falls; unusually weak or tired -suicidal thoughts or other mood  changes Side effects that usually do not require medical attention (report to your doctor or health care professional if they continue or are bothersome): -dizziness -dry mouth -nausea, vomiting -tiredness This list may not describe all possible side effects. Call your doctor for medical advice about side effects. You may report side effects to FDA at 1-800-FDA-1088. Where should I keep my medicine? Keep out of the reach of children. This medicine can be abused. Keep your medicine in a safe place to protect it from theft. Do not share this medicine with anyone. Selling or giving away this medicine is dangerous and against the law. Store at room temperature between 20 and 25 degrees C (68 and 77 degrees F). This medicine may cause accidental overdose and death if taken by other adults, children, or pets. Mix any unused medicine with a substance like cat litter or coffee grounds. Then throw the medicine away in a sealed container like a sealed bag or a coffee can with a lid. Do not use the medicine after the expiration date. NOTE: This sheet is a summary. It may not cover all possible information. If you have questions about this medicine, talk to your doctor, pharmacist, or health care provider.  2018 Elsevier/Gold Standard (2015-06-21 13:47:25)    IF you received an x-ray today, you will receive an invoice from [facility]. Please contact [facility] at [phone] with questions or concerns regarding your invoice.   IF you received labwork today, you will receive an invoice from [facility]. Please contact [facility] at [phone] with questions or concerns regarding your invoice.   Our billing staff will not be able to assist you with questions regarding bills from these companies.  You will be contacted with the lab results as soon as they are  available. The fastest way to get your results is to activate your My Chart account. Instructions are located on the last page of this paperwork. If you have not heard from [place] regarding the results in 2 weeks, please contact this office.

## 2017-10-06 HISTORY — PX: OTHER SURGICAL HISTORY: SHX169

## 2017-10-08 ENCOUNTER — Encounter: Payer: Self-pay | Admitting: Urgent Care

## 2017-10-28 ENCOUNTER — Ambulatory Visit: Payer: Self-pay | Admitting: *Deleted

## 2017-10-28 NOTE — Telephone Encounter (Signed)
Pt calling with complaints of sore throat, productive cough, chest congestion and feeling "achy". Pt states her daughter was diagnosed with the Flu on yesterday and was given a Tamiflu prescription. Pt's starts she began to have symptoms today.Pt has not had the Flu vaccine this year.Pt has seen Wenda Overland  with the most recent visit being on 10/05/17.  Pt states if she has to come in for an office visit she would have to bring her daughter in with her as well who has already been diagnosed with the Flu. If antiviral medication could be called in the pt request for medication to be sent to [facility] in [place] on [address].  Reason for Disposition . [1] Influenza EXPOSURE within last 72 hours (3 days) AND [2] NOT HIGH RISK AND [3] strongly requests antiviral medication  Answer Assessment - Initial Assessment Questions 1. TYPE of EXPOSURE: "How were you exposed?" (e.g., close contact, not a close contact)     Daughter was diagnosed with Flu on yesterday 2. DATE of EXPOSURE: "When did the exposure occur?" (e.g., hour, days, weeks)     Daughter's symptoms started on Monday 3. PREGNANCY: "Is there any chance you are pregnant?" "When was your last menstrual period?"     No 4. HIGH RISK for COMPLICATIONS: "Do you have any heart or lung problems? Do you have a weakened immune system?" (e.g., CHF, COPD, asthma, HIV positive, chemotherapy, renal failure, diabetes mellitus, sickle cell anemia)     Asthma 5. SYMPTOMS: "Do you have any symptoms?" (e.g., cough, fever, sore throat, difficulty breathing).     Sore throat, chest congestion, cough, feeling "achy"  Protocols used: INFLUENZA EXPOSURE-A-AH

## 2017-10-29 ENCOUNTER — Telehealth: Payer: Self-pay | Admitting: Urgent Care

## 2017-10-29 MED ORDER — OSELTAMIVIR PHOSPHATE 75 MG PO CAPS: 75.0000 mg | ORAL_CAPSULE | Freq: Two times a day (BID) | ORAL | 0 refills | Status: DC

## 2017-10-29 NOTE — Telephone Encounter (Signed)
Copied from [place] [id number]. Topic: Quick Communication - See Telephone Encounter >> Oct 29, 2017  1:55 PM Bea Graff, NT wrote: CRM for notification. See Telephone encounter for: Pt calling to check with Jaynee Eagles regarding her flu like symptoms she called about yesterday.  10/29/17.

## 2017-10-29 NOTE — Telephone Encounter (Signed)
Message sent to [facility]

## 2017-10-29 NOTE — Telephone Encounter (Signed)
Will send script to patient's pharmacy for Tamiflu to be taken twice daily for 5 days. Set up office visit if symptoms persist despite Tamiflu.

## 2017-10-30 NOTE — Telephone Encounter (Signed)
Informed Pt this morning about Rx sent to her pharmacy for her symptoms and ready for her to pick up, she verbalized understanding.  Also informed pt that if she does not feel any better after taking this medication to call and make an appointment. She verbalized understanding.

## 2017-10-31 ENCOUNTER — Encounter: Payer: Self-pay | Admitting: Emergency Medicine

## 2017-10-31 ENCOUNTER — Ambulatory Visit: Payer: BC Managed Care – PPO | Admitting: Emergency Medicine

## 2017-10-31 ENCOUNTER — Other Ambulatory Visit: Payer: Self-pay

## 2017-10-31 VITALS — BP 112/86 | HR 98 | Temp 98.3°F | Resp 16 | Ht 70.0 in | Wt 296.4 lb

## 2017-10-31 DIAGNOSIS — J029 Acute pharyngitis, unspecified: Secondary | ICD-10-CM | POA: Insufficient documentation

## 2017-10-31 DIAGNOSIS — Z20818 Contact with and (suspected) exposure to other bacterial communicable diseases: Secondary | ICD-10-CM | POA: Insufficient documentation

## 2017-10-31 DIAGNOSIS — Z20828 Contact with and (suspected) exposure to other viral communicable diseases: Secondary | ICD-10-CM | POA: Diagnosis not present

## 2017-10-31 MED ORDER — AZITHROMYCIN 250 MG PO TABS: ORAL_TABLET | ORAL | 0 refills | Status: DC

## 2017-10-31 NOTE — Progress Notes (Signed)
Victoria Ewing 42 y.o.   Chief Complaint  Patient presents with  . Fever    x 3 days  per patient she feels hot  . Sore Throat    HISTORY OF PRESENT ILLNESS: This is a 42 y.o. female complaining of fever and sore throat x 3-4 days. Daughter recently diagnosed with influenza and strep throat; patient started on Tamiflu 2 days ago.  Sore Throat   This is a new problem. The current episode started in the past 7 days. The problem has been gradually worsening. There has been no fever. The pain is mild. Associated symptoms include congestion, coughing and headaches. Pertinent negatives include no abdominal pain, diarrhea, ear pain, neck pain, shortness of breath, swollen glands, trouble swallowing or vomiting. She has had exposure to strep.     Prior to Admission medications   Medication Sig Start Date End Date Taking? Authorizing Provider  albuterol (PROVENTIL HFA;VENTOLIN HFA) 108 (90 Base) MCG/ACT inhaler Inhale 2 puffs into the lungs every 6 (six) hours as needed for wheezing. 11/05/16  Yes Forrest Moron, MD  ALPRAZolam (XANAX) 0.25 MG tablet TAKE 1 TO 1 & 1/2 TABLETS ONCE DAILY AS NEEDED 10/05/17  Yes Jaynee Eagles, PA-C  cholecalciferol (VITAMIN D) 1000 UNITS tablet Take 2,000 Units by mouth daily. Reported on 02/08/2016   Yes [provider]  fluticasone (FLONASE) 50 MCG/ACT nasal spray Place 2 sprays into both nostrils daily. 12/31/16  Yes Forrest Moron, MD  oseltamivir (TAMIFLU) 75 MG capsule Take 1 capsule (75 mg total) by mouth 2 (two) times daily. 10/29/17  Yes Jaynee Eagles, PA-C  sertraline (ZOLOFT) 50 MG tablet Take 1 tablet (50 mg total) by mouth daily. 08/26/17  Yes Jaynee Eagles, PA-C  triamcinolone ointment (KENALOG) 0.1 % Apply 1 application topically 2 (two) times daily. 12/19/15  Yes Jaynee Eagles, PA-C  loratadine (CLARITIN) 10 MG tablet Take 10 mg by mouth daily.    [provider]    Allergies  Allergen Reactions  . Other Anaphylaxis    Pecans and  nuts   . Effexor Xr [Venlafaxine Hcl Er]     Patient Active Problem List   Diagnosis Date Noted  . Cough 08/05/2017  . Fever 08/05/2017  . Lower respiratory infection 08/05/2017  . BMI 40.0-44.9, adult ([employer]) 03/05/2017  . Eczema 12/14/2014  . Asthma, chronic 08/30/2014  . Anxiety state 07/22/2013  . Allergic rhinitis 07/22/2013    Past Medical History:  Diagnosis Date  . Allergy   . Anxiety   . Asthma   . Depression     Past Surgical History:  Procedure Laterality Date  . CESAREAN SECTION      Social History   Socioeconomic History  . Marital status: Single    Spouse name: Not on file  . Number of children: Not on file  . Years of education: Not on file  . Highest education level: Not on file  Social Needs  . Financial resource strain: Not on file  . Food insecurity - worry: Not on file  . Food insecurity - inability: Not on file  . Transportation needs - medical: Not on file  . Transportation needs - non-medical: Not on file  Occupational History  . Not on file  Tobacco Use  . Smoking status: Never Smoker  . Smokeless tobacco: Never Used  Substance and Sexual Activity  . Alcohol use: Yes    Alcohol/week: 0.0 oz  . Drug use: No  . Sexual activity: Not Currently  Other  Topics Concern  . Not on file  Social History Narrative  . Not on file    Family History  Problem Relation Age of Onset  . Cancer Mother        pancreatic  . Stroke Mother 32  . Diabetes Mother   . Kidney disease Mother        transplant in 2010  . Hypertension Mother   . Transient ischemic attack Father 79  . Cancer Father        prostate cancer?  . Stroke Maternal Grandmother 110  . Hypertension Sister 53  . Stroke Cousin 45  . Rheum arthritis Cousin      Review of Systems  Constitutional: Positive for malaise/fatigue. Negative for chills and fever.  HENT: Positive for congestion and sore throat. Negative for ear pain and trouble swallowing.   Eyes: Negative for discharge  and redness.  Respiratory: Positive for cough. Negative for hemoptysis and shortness of breath.   Cardiovascular: Negative.  Negative for chest pain, palpitations and leg swelling.  Gastrointestinal: Negative for abdominal pain, blood in stool, diarrhea, melena, nausea and vomiting.  Genitourinary: Negative.  Negative for dysuria and hematuria.  Musculoskeletal: Negative for joint pain, myalgias and neck pain.  Skin: Negative for rash.  Neurological: Positive for weakness and headaches. Negative for dizziness, sensory change and focal weakness.  Endo/Heme/Allergies: Negative.   All other systems reviewed and are negative.   Vitals:   10/31/17 1004  BP: 112/86  Pulse: 98  Resp: 16  Temp: 98.3 F (36.8 C)  SpO2: 98%    Physical Exam  Constitutional: She is oriented to person, place, and time. She appears well-developed and well-nourished.  HENT:  Head: Normocephalic and atraumatic.  Right Ear: External ear normal.  Left Ear: External ear normal.  Nose: Nose normal.  Mouth/Throat: Uvula is midline. No uvula swelling. Posterior oropharyngeal erythema present. No oropharyngeal exudate, posterior oropharyngeal edema or tonsillar abscesses.  Eyes: Conjunctivae and EOM are normal. Pupils are equal, round, and reactive to light.  Neck: Normal range of motion. Neck supple. No JVD present.  Cardiovascular: Normal rate, regular rhythm and normal heart sounds.  Pulmonary/Chest: Effort normal and breath sounds normal.  Abdominal: Soft. Bowel sounds are normal. She exhibits no distension. There is no tenderness.  Musculoskeletal: Normal range of motion.  Lymphadenopathy:    She has no cervical adenopathy.  Neurological: She is alert and oriented to person, place, and time. No sensory deficit. She exhibits normal muscle tone.  Skin: Skin is warm and dry. Capillary refill takes less than 2 seconds.  Psychiatric: She has a normal mood and affect. Her behavior is normal.  Vitals  reviewed.    ASSESSMENT & PLAN: Victoria Ewing was seen today for fever and sore throat.  Diagnoses and all orders for this visit:  Acute pharyngitis, unspecified etiology -     Discontinue: azithromycin (ZITHROMAX) 250 MG tablet; Sig as indicated -     azithromycin (ZITHROMAX) 250 MG tablet; Sig as indicated  Sore throat  Exposure to strep throat  Exposure to influenza    Patient Instructions       IF you received an x-ray today, you will receive an invoice from [facility]. Please contact [facility] at [phone] with questions or concerns regarding your invoice.   IF you received labwork today, you will receive an invoice from [facility]. Please contact [facility] at [phone] with questions or concerns regarding your invoice.   Our billing staff will not be able to assist you  with questions regarding bills from these companies.  You will be contacted with the lab results as soon as they are available. The fastest way to get your results is to activate your My Chart account. Instructions are located on the last page of this paperwork. If you have not heard from [place] regarding the results in 2 weeks, please contact this office.     Sore Throat When you have a sore throat, your throat may:  Hurt.  Burn.  Feel irritated.  Feel scratchy.  Many things can cause a sore throat, including:  An infection.  Allergies.  Dryness in the air.  Smoke or pollution.  Gastroesophageal reflux disease (GERD).  A tumor.  A sore throat can be the first sign of another sickness. It can happen with other problems, like coughing or a fever. Most sore throats go away without treatment. Follow these instructions at home:  Take over-the-counter medicines only as told by your doctor.  Drink enough fluids to keep your pee (urine) clear or pale yellow.  Rest when you feel you need to.  To help with pain, try: ? Sipping warm liquids, such as broth, herbal tea,  or warm water. ? Eating or drinking cold or frozen liquids, such as frozen ice pops. ? Gargling with a salt-water mixture 3-4 times a day or as needed. To make a salt-water mixture, add -1 tsp of salt in 1 cup of warm water. Mix it until you cannot see the salt anymore. ? Sucking on hard candy or throat lozenges. ? Putting a cool-mist humidifier in your bedroom at night. ? Sitting in the bathroom with the door closed for 5-10 minutes while you run hot water in the shower.  Do not use any tobacco products, such as cigarettes, chewing tobacco, and e-cigarettes. If you need help quitting, ask your doctor. Contact a doctor if:  You have a fever for more than 2-3 days.  You keep having symptoms for more than 2-3 days.  Your throat does not get better in 7 days.  You have a fever and your symptoms suddenly get worse. Get help right away if:  You have trouble breathing.  You cannot swallow fluids, soft foods, or your saliva.  You have swelling in your throat or neck that gets worse.  You keep feeling like you are going to throw up (vomit).  You keep throwing up. This information is not intended to replace advice given to you by your health care provider. Make sure you discuss any questions you have with your health care provider. Document Released: 07/01/2008 Document Revised: 05/18/2016 Document Reviewed: 07/13/2015 Elsevier Interactive Patient Education  2018 Elsevier Inc.      Agustina Caroli, MD [facility]

## 2017-10-31 NOTE — Patient Instructions (Addendum)
     IF you received an x-ray today, you will receive an invoice from [facility]. Please contact [facility] at [phone] with questions or concerns regarding your invoice.   IF you received labwork today, you will receive an invoice from [facility]. Please contact [facility] at [phone] with questions or concerns regarding your invoice.   Our billing staff will not be able to assist you with questions regarding bills from these companies.  You will be contacted with the lab results as soon as they are available. The fastest way to get your results is to activate your My Chart account. Instructions are located on the last page of this paperwork. If you have not heard from us regarding the results in 2 weeks, please contact this office.     Sore Throat When you have a sore throat, your throat may:  Hurt.  Burn.  Feel irritated.  Feel scratchy.  Many things can cause a sore throat, including:  An infection.  Allergies.  Dryness in the air.  Smoke or pollution.  Gastroesophageal reflux disease (GERD).  A tumor.  A sore throat can be the first sign of another sickness. It can happen with other problems, like coughing or a fever. Most sore throats go away without treatment. Follow these instructions at home:  Take over-the-counter medicines only as told by your doctor.  Drink enough fluids to keep your pee (urine) clear or pale yellow.  Rest when you feel you need to.  To help with pain, try: ? Sipping warm liquids, such as broth, herbal tea, or warm water. ? Eating or drinking cold or frozen liquids, such as frozen ice pops. ? Gargling with a salt-water mixture 3-4 times a day or as needed. To make a salt-water mixture, add -1 tsp of salt in 1 cup of warm water. Mix it until you cannot see the salt anymore. ? Sucking on hard candy or throat lozenges. ? Putting a cool-mist humidifier in your bedroom at night. ? Sitting in the bathroom with the  door closed for 5-10 minutes while you run hot water in the shower.  Do not use any tobacco products, such as cigarettes, chewing tobacco, and e-cigarettes. If you need help quitting, ask your doctor. Contact a doctor if:  You have a fever for more than 2-3 days.  You keep having symptoms for more than 2-3 days.  Your throat does not get better in 7 days.  You have a fever and your symptoms suddenly get worse. Get help right away if:  You have trouble breathing.  You cannot swallow fluids, soft foods, or your saliva.  You have swelling in your throat or neck that gets worse.  You keep feeling like you are going to throw up (vomit).  You keep throwing up. This information is not intended to replace advice given to you by your health care provider. Make sure you discuss any questions you have with your health care provider. Document Released: 07/01/2008 Document Revised: 05/18/2016 Document Reviewed: 07/13/2015 Elsevier Interactive Patient Education  2018 Elsevier Inc.  

## 2018-01-14 ENCOUNTER — Other Ambulatory Visit: Payer: Self-pay | Admitting: Urgent Care

## 2018-01-14 NOTE — Telephone Encounter (Signed)
Please schedule patient for follow up on her anxiety. I provided one final courtesy refill of her Xanax.  But I cannot continue to do refills without discussing long-term management of her anxiety.

## 2018-01-14 NOTE — Telephone Encounter (Signed)
Called pt and left VM - per DPR- to call office and make an appt with Jaynee Eagles to discuss her anxiety. I did advise that her RX was ready but it would be the last refill without an OV. When pt calls in, please schedule her with Bess Harvest at her convenience.  Thanks!

## 2018-02-14 ENCOUNTER — Other Ambulatory Visit: Payer: Self-pay | Admitting: Urgent Care

## 2018-04-27 ENCOUNTER — Encounter: Payer: Self-pay | Admitting: Urgent Care

## 2018-04-27 ENCOUNTER — Ambulatory Visit: Payer: BC Managed Care – PPO | Admitting: Urgent Care

## 2018-04-27 VITALS — BP 143/80 | HR 94 | Temp 98.4°F | Resp 17 | Ht 70.0 in | Wt 309.0 lb

## 2018-04-27 DIAGNOSIS — J3089 Other allergic rhinitis: Secondary | ICD-10-CM

## 2018-04-27 DIAGNOSIS — F419 Anxiety disorder, unspecified: Secondary | ICD-10-CM | POA: Diagnosis not present

## 2018-04-27 DIAGNOSIS — Z6841 Body Mass Index (BMI) 40.0 and over, adult: Secondary | ICD-10-CM

## 2018-04-27 DIAGNOSIS — J4530 Mild persistent asthma, uncomplicated: Secondary | ICD-10-CM

## 2018-04-27 DIAGNOSIS — M542 Cervicalgia: Secondary | ICD-10-CM | POA: Diagnosis not present

## 2018-04-27 DIAGNOSIS — R0683 Snoring: Secondary | ICD-10-CM | POA: Diagnosis not present

## 2018-04-27 DIAGNOSIS — L2082 Flexural eczema: Secondary | ICD-10-CM

## 2018-04-27 DIAGNOSIS — R0600 Dyspnea, unspecified: Secondary | ICD-10-CM | POA: Diagnosis not present

## 2018-04-27 DIAGNOSIS — D170 Benign lipomatous neoplasm of skin and subcutaneous tissue of head, face and neck: Secondary | ICD-10-CM

## 2018-04-27 MED ORDER — ALPRAZOLAM 0.25 MG PO TABS: ORAL_TABLET | ORAL | 0 refills | Status: DC

## 2018-04-27 MED ORDER — ALBUTEROL SULFATE HFA 108 (90 BASE) MCG/ACT IN AERS: 2.0000 | INHALATION_SPRAY | Freq: Four times a day (QID) | RESPIRATORY_TRACT | 1 refills | Status: DC | PRN

## 2018-04-27 MED ORDER — SERTRALINE HCL 50 MG PO TABS: 50.0000 mg | ORAL_TABLET | Freq: Every day | ORAL | 1 refills | Status: DC

## 2018-04-27 MED ORDER — CETIRIZINE HCL 10 MG PO TABS: 10.0000 mg | ORAL_TABLET | Freq: Every day | ORAL | 3 refills | Status: DC

## 2018-04-27 MED ORDER — MONTELUKAST SODIUM 10 MG PO TABS: 10.0000 mg | ORAL_TABLET | Freq: Every day | ORAL | 1 refills | Status: DC

## 2018-04-27 MED ORDER — TRIAMCINOLONE ACETONIDE 0.1 % EX CREA: 1.0000 "application " | TOPICAL_CREAM | Freq: Two times a day (BID) | CUTANEOUS | 5 refills | Status: DC

## 2018-04-27 NOTE — Patient Instructions (Addendum)
I provided you with information for alternatives to Xanax.  Vistaril and propranolol or medications that are recommended that are safe and lower risk than a controlled substance like Xanax.  These medicines have the capacity to help with anxiety.  However, it is highly recommended to manage anxiety with the maintenance therapy like sertraline (Zoloft).  I will follow-up with you in 6 weeks and we can decide on how to proceed in terms of managing your anxiety long-term.   Sertraline tablets What is this medicine? SERTRALINE (SER tra leen) is used to treat depression. It may also be used to treat obsessive compulsive disorder, panic disorder, post-trauma stress, premenstrual dysphoric disorder (PMDD) or social anxiety. This medicine may be used for other purposes; ask your health care provider or pharmacist if you have questions. COMMON BRAND NAME(S): Zoloft What should I tell my health care provider before I take this medicine? They need to know if you have any of these conditions: -bleeding disorders -bipolar disorder or a family history of bipolar disorder -glaucoma -heart disease -high blood pressure -history of irregular heartbeat -history of low levels of calcium, magnesium, or potassium in the blood -if you often drink alcohol -liver disease -receiving electroconvulsive therapy -seizures -suicidal thoughts, plans, or attempt; a previous suicide attempt by you or a family member -take medicines that treat or prevent blood clots -thyroid disease -an unusual or allergic reaction to sertraline, other medicines, foods, dyes, or preservatives -pregnant or trying to get pregnant -breast-feeding How should I use this medicine? Take this medicine by mouth with a glass of water. Follow the directions on the prescription label. You can take it with or without food. Take your medicine at regular intervals. Do not take your medicine more often than directed. Do not stop taking this medicine  suddenly except upon the advice of your doctor. Stopping this medicine too quickly may cause serious side effects or your condition may worsen. A special MedGuide will be given to you by the pharmacist with each prescription and refill. Be sure to read this information carefully each time. Talk to your pediatrician regarding the use of this medicine in children. While this drug may be prescribed for children as young as 7 years for selected conditions, precautions do apply. Overdosage: If you think you have taken too much of this medicine contact a poison control center or emergency room at once. NOTE: This medicine is only for you. Do not share this medicine with others. What if I miss a dose? If you miss a dose, take it as soon as you can. If it is almost time for your next dose, take only that dose. Do not take double or extra doses. What may interact with this medicine? Do not take this medicine with any of the following medications: -cisapride -dofetilide -dronedarone -linezolid -MAOIs like Carbex, Eldepryl, Marplan, Nardil, and Parnate -methylene blue (injected into a vein) -pimozide -thioridazine This medicine may also interact with the following medications: -alcohol -amphetamines -aspirin and aspirin-like medicines -certain medicines for depression, anxiety, or psychotic disturbances -certain medicines for fungal infections like ketoconazole, fluconazole, posaconazole, and itraconazole -certain medicines for irregular heart beat like flecainide, quinidine, propafenone -certain medicines for migraine headaches like almotriptan, eletriptan, frovatriptan, naratriptan, rizatriptan, sumatriptan, zolmitriptan -certain medicines for sleep -certain medicines for seizures like carbamazepine, valproic acid, phenytoin -certain medicines that treat or prevent blood clots like warfarin, enoxaparin, dalteparin -cimetidine -digoxin -diuretics -fentanyl -isoniazid -lithium -NSAIDs,  medicines for pain and inflammation, like ibuprofen or naproxen -other medicines that prolong  the QT interval (cause an abnormal heart rhythm) -rasagiline -safinamide -supplements like St. John's wort, kava kava, valerian -tolbutamide -tramadol -tryptophan This list may not describe all possible interactions. Give your health care provider a list of all the medicines, herbs, non-prescription drugs, or dietary supplements you use. Also tell them if you smoke, drink alcohol, or use illegal drugs. Some items may interact with your medicine. What should I watch for while using this medicine? Tell your doctor if your symptoms do not get better or if they get worse. Visit your doctor or health care professional for regular checks on your progress. Because it may take several weeks to see the full effects of this medicine, it is important to continue your treatment as prescribed by your doctor. Patients and their families should watch out for new or worsening thoughts of suicide or depression. Also watch out for sudden changes in feelings such as feeling anxious, agitated, panicky, irritable, hostile, aggressive, impulsive, severely restless, overly excited and hyperactive, or not being able to sleep. If this happens, especially at the beginning of treatment or after a change in dose, call your health care professional. Dennis Bast may get drowsy or dizzy. Do not drive, use machinery, or do anything that needs mental alertness until you know how this medicine affects you. Do not stand or sit up quickly, especially if you are an older patient. This reduces the risk of dizzy or fainting spells. Alcohol may interfere with the effect of this medicine. Avoid alcoholic drinks. Your mouth may get dry. Chewing sugarless gum or sucking hard candy, and drinking plenty of water may help. Contact your doctor if the problem does not go away or is severe. What side effects may I notice from receiving this medicine? Side effects  that you should report to your doctor or health care professional as soon as possible: -allergic reactions like skin rash, itching or hives, swelling of the face, lips, or tongue -anxious -black, tarry stools -changes in vision -confusion -elevated mood, decreased need for sleep, racing thoughts, impulsive behavior -eye pain -fast, irregular heartbeat -feeling faint or lightheaded, falls -feeling agitated, angry, or irritable -hallucination, loss of contact with reality -loss of balance or coordination -loss of memory -painful or prolonged erections -restlessness, pacing, inability to keep still -seizures -stiff muscles -suicidal thoughts or other mood changes -trouble sleeping -unusual bleeding or bruising -unusually weak or tired -vomiting Side effects that usually do not require medical attention (report to your doctor or health care professional if they continue or are bothersome): -change in appetite or weight -change in sex drive or performance -diarrhea -increased sweating -indigestion, nausea -tremors This list may not describe all possible side effects. Call your doctor for medical advice about side effects. You may report side effects to FDA at 1-800-FDA-1088. Where should I keep my medicine? Keep out of the reach of children. Store at room temperature between 15 and 30 degrees C (59 and 86 degrees F). Throw away any unused medicine after the expiration date. NOTE: This sheet is a summary. It may not cover all possible information. If you have questions about this medicine, talk to your doctor, pharmacist, or health care provider.  2018 Elsevier/Gold Standard (2016-09-26 14:17:49)    Hydroxyzine capsules or tablets What is this medicine? HYDROXYZINE (hye Hiltonia i zeen) is an antihistamine. This medicine is used to treat allergy symptoms. It is also used to treat anxiety and tension. This medicine can be used with other medicines to induce sleep before surgery. This  medicine  may be used for other purposes; ask your health care provider or pharmacist if you have questions. COMMON BRAND NAME(S): ANX, Atarax, Rezine, Vistaril What should I tell my health care provider before I take this medicine? They need to know if you have any of these conditions: -any chronic illness -difficulty passing urine -glaucoma -heart disease -kidney disease -liver disease -lung disease -an unusual or allergic reaction to hydroxyzine, cetirizine, other medicines, foods, dyes, or preservatives -pregnant or trying to get pregnant -breast-feeding How should I use this medicine? Take this medicine by mouth with a full glass of water. Follow the directions on the prescription label. You may take this medicine with food or on an empty stomach. Take your medicine at regular intervals. Do not take your medicine more often than directed. Talk to your pediatrician regarding the use of this medicine in children. Special care may be needed. While this drug may be prescribed for children as young as 6 years of age for selected conditions, precautions do apply. Patients over 4 years old may have a stronger reaction and need a smaller dose. Overdosage: If you think you have taken too much of this medicine contact a poison control center or emergency room at once. NOTE: This medicine is only for you. Do not share this medicine with others. What if I miss a dose? If you miss a dose, take it as soon as you can. If it is almost time for your next dose, take only that dose. Do not take double or extra doses. What may interact with this medicine? -alcohol -barbiturate medicines for sleep or seizures -medicines for colds, allergies -medicines for depression, anxiety, or emotional disturbances -medicines for pain -medicines for sleep -muscle relaxants This list may not describe all possible interactions. Give your health care provider a list of all the medicines, herbs, non-prescription drugs,  or dietary supplements you use. Also tell them if you smoke, drink alcohol, or use illegal drugs. Some items may interact with your medicine. What should I watch for while using this medicine? Tell your doctor or health care professional if your symptoms do not improve. You may get drowsy or dizzy. Do not drive, use machinery, or do anything that needs mental alertness until you know how this medicine affects you. Do not stand or sit up quickly, especially if you are an older patient. This reduces the risk of dizzy or fainting spells. Alcohol may interfere with the effect of this medicine. Avoid alcoholic drinks. Your mouth may get dry. Chewing sugarless gum or sucking hard candy, and drinking plenty of water may help. Contact your doctor if the problem does not go away or is severe. This medicine may cause dry eyes and blurred vision. If you wear contact lenses you may feel some discomfort. Lubricating drops may help. See your eye doctor if the problem does not go away or is severe. If you are receiving skin tests for allergies, tell your doctor you are using this medicine. What side effects may I notice from receiving this medicine? Side effects that you should report to your doctor or health care professional as soon as possible: -fast or irregular heartbeat -difficulty passing urine -seizures -slurred speech or confusion -tremor Side effects that usually do not require medical attention (report to your doctor or health care professional if they continue or are bothersome): -constipation -drowsiness -fatigue -headache -stomach upset This list may not describe all possible side effects. Call your doctor for medical advice about side effects. You may report side  effects to FDA at 1-800-FDA-1088. Where should I keep my medicine? Keep out of the reach of children. Store at room temperature between 15 and 30 degrees C (59 and 86 degrees F). Keep container tightly closed. Throw away any unused  medicine after the expiration date. NOTE: This sheet is a summary. It may not cover all possible information. If you have questions about this medicine, talk to your doctor, pharmacist, or health care provider.  2018 Elsevier/Gold Standard (2008-02-04 14:50:59)    Propranolol tablets What is this medicine? PROPRANOLOL (proe PRAN oh lole) is a beta-blocker. Beta-blockers reduce the workload on the heart and help it to beat more regularly. This medicine is used to treat high blood pressure, to control irregular heart rhythms (arrhythmias) and to relieve chest pain caused by angina. It may also be helpful after a heart attack. This medicine is also used to prevent migraine headaches, relieve uncontrollable shaking (tremors), and help certain problems related to the thyroid gland and adrenal gland. This medicine may be used for other purposes; ask your health care provider or pharmacist if you have questions. COMMON BRAND NAME(S): Inderal What should I tell my health care provider before I take this medicine? They need to know if you have any of these conditions: -circulation problems or blood vessel disease -diabetes -history of heart attack or heart disease, vasospastic angina -kidney disease -liver disease -lung or breathing disease, like asthma or emphysema -pheochromocytoma -slow heart rate -thyroid disease -an unusual or allergic reaction to propranolol, other beta-blockers, medicines, foods, dyes, or preservatives -pregnant or trying to get pregnant -breast-feeding How should I use this medicine? Take this medicine by mouth with a glass of water. Follow the directions on the prescription label. Take your doses at regular intervals. Do not take your medicine more often than directed. Do not stop taking except on your the advice of your doctor or health care professional. Talk to your pediatrician regarding the use of this medicine in children. Special care may be needed. Overdosage: If  you think you have taken too much of this medicine contact a poison control center or emergency room at once. NOTE: This medicine is only for you. Do not share this medicine with others. What if I miss a dose? If you miss a dose, take it as soon as you can. If it is almost time for your next dose, take only that dose. Do not take double or extra doses. What may interact with this medicine? Do not take this medicine with any of the following medications: -feverfew -phenothiazines like chlorpromazine, mesoridazine, prochlorperazine, thioridazine This medicine may also interact with the following medications: -aluminum hydroxide gel -antipyrine -antiviral medicines for HIV or AIDS -barbiturates like phenobarbital -certain medicines for blood pressure, heart disease, irregular heart beat -cimetidine -ciprofloxacin -diazepam -fluconazole -haloperidol -isoniazid -medicines for cholesterol like cholestyramine or colestipol -medicines for mental depression -medicines for migraine headache like almotriptan, eletriptan, frovatriptan, naratriptan, rizatriptan, sumatriptan, zolmitriptan -NSAIDs, medicines for pain and inflammation, like ibuprofen or naproxen -phenytoin -rifampin -teniposide -theophylline -thyroid medicines -tolbutamide -warfarin -zileuton This list may not describe all possible interactions. Give your health care provider a list of all the medicines, herbs, non-prescription drugs, or dietary supplements you use. Also tell them if you smoke, drink alcohol, or use illegal drugs. Some items may interact with your medicine. What should I watch for while using this medicine? Visit your doctor or health care professional for regular check ups. Check your blood pressure and pulse rate regularly. Ask your health care  professional what your blood pressure and pulse rate should be, and when you should contact them. You may get drowsy or dizzy. Do not drive, use machinery, or do anything  that needs mental alertness until you know how this drug affects you. Do not stand or sit up quickly, especially if you are an older patient. This reduces the risk of dizzy or fainting spells. Alcohol can make you more drowsy and dizzy. Avoid alcoholic drinks. This medicine can affect blood sugar levels. If you have diabetes, check with your doctor or health care professional before you change your diet or the dose of your diabetic medicine. Do not treat yourself for coughs, colds, or pain while you are taking this medicine without asking your doctor or health care professional for advice. Some ingredients may increase your blood pressure. What side effects may I notice from receiving this medicine? Side effects that you should report to your doctor or health care professional as soon as possible: -allergic reactions like skin rash, itching or hives, swelling of the face, lips, or tongue -breathing problems -changes in blood sugar -cold hands or feet -difficulty sleeping, nightmares -dry peeling skin -hallucinations -muscle cramps or weakness -slow heart rate -swelling of the legs and ankles -vomiting Side effects that usually do not require medical attention (report to your doctor or health care professional if they continue or are bothersome): -change in sex drive or performance -diarrhea -dry sore eyes -hair loss -nausea -weak or tired This list may not describe all possible side effects. Call your doctor for medical advice about side effects. You may report side effects to FDA at 1-800-FDA-1088. Where should I keep my medicine? Keep out of the reach of children. Store at room temperature between 15 and 30 degrees C (59 and 86 degrees F). Protect from light. Throw away any unused medicine after the expiration date. NOTE: This sheet is a summary. It may not cover all possible information. If you have questions about this medicine, talk to your doctor, pharmacist, or health care  provider.  2018 Elsevier/Gold Standard (2013-05-27 14:51:53)      IF you received an x-ray today, you will receive an invoice from [facility]. Please contact [facility] at [phone] with questions or concerns regarding your invoice.   IF you received labwork today, you will receive an invoice from [facility]. Please contact [facility] at [phone] with questions or concerns regarding your invoice.   Our billing staff will not be able to assist you with questions regarding bills from these companies.  You will be contacted with the lab results as soon as they are available. The fastest way to get your results is to activate your My Chart account. Instructions are located on the last page of this paperwork. If you have not heard from [place] regarding the results in 2 weeks, please contact this office.

## 2018-04-27 NOTE — Progress Notes (Signed)
MRN: [id number] DOB: 1976/08/08  Subjective:   Victoria Ewing is a 42 y.o. female presenting for medication refills.  Extrinsic asthma - Needs a refill of her albuterol inhaler. Has used it as needed for bronchospasms from illness, allergies, exercise. Has used it for the flu, allergies and some exercise this year. Denies using it frequently, multiple times per week. Denies smoking cigarettes. Denies fever, chest tightness, shob, wheezing. However, she does admit waking up in the middle of the night gasping for air. Also reports snoring, day time fatigue.  Allergies - Has had persistent congestion this season. Was using Claritin but stopped this due to not having relief from her symptoms. Has been using Flonase, otc eye drops for itchy eyes. Has had post-nasal drainage, throat discomfort. Denies sinus pain, ear pain, ear drainage, cough.   Lipoma - Reports several year history of mass over her right posterior neck. Has had work up including imaging with ultrasound, surgical consult. U/S findings were most consistent with lipoma, 07/24/2015. Has neck pain that radiates upwards into neck, her right trapezius/shoulder. This past week has become painful, is having a very difficult time sleeping as a result. She would like to have it worked on now. However, she is very weary of general anaesthesia as recommended at [facility]. She is requesting a consult with a dermatologist with [facility]. Denies weakness of limbs, drainage of pus or bleeding.   Anxiety - Has previously had difficulty with compliance. I have worked with patient in the past, was agreeable to using Zoloft 50mg  but has not used this daily. Has had Xanax 1.5 of the 0.25mg  tablets. Patient took a vacation/visit back to [place] with family and friends. She came back, is currently on vacation. 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 Has felt more unsteady since being back, feels a need for more Xanax  since being here. Has felt overstimulated and like too much is going on. Also feels this way with her daughter's cheerleading. She has been in contact with [facility], plans on scheduling appointment with Juliann Pulse prior to starting her work again. Has occasional alcohol drink.   Victoria Ewing has a current medication list which includes the following prescription(s): albuterol, alprazolam, cholecalciferol, fluticasone, hydrocortisone, sertraline, and triamcinolone ointment. Also is allergic to other and effexor xr [venlafaxine hcl er].  Victoria Ewing  has a past medical history of Allergy, Anxiety, Asthma, and Depression. Also  has a past surgical history that includes Cesarean section.  Objective:   Vitals: BP (!) 143/80   Pulse 94   Temp 98.4 F (36.9 C) (Oral)   Resp 17   Ht 5\' 10"  (1.778 m)   Wt (!) 309 lb (140.2 kg)   SpO2 98%   BMI 44.34 kg/m   BP Readings from Last 3 Encounters:  04/27/18 (!) 143/80  10/31/17 112/86  10/05/17 140/90    Physical Exam  Constitutional: She is oriented to person, place, and time. She appears well-developed and well-nourished.  HENT:  Mouth/Throat: Oropharynx is clear and moist.  Eyes: Pupils are equal, round, and reactive to light. EOM are normal. Right eye exhibits no discharge. Left eye exhibits no discharge. No scleral icterus.  Neck: Normal range of motion. Neck supple. No thyromegaly present.  Cardiovascular: Normal rate, regular rhythm, normal heart sounds and intact distal pulses. Exam reveals no gallop and no friction rub.  No murmur heard. Pulmonary/Chest: Effort normal and breath sounds normal. No stridor. No respiratory distress. She has no wheezes. She has  no rales.  Abdominal: Soft. Bowel sounds are normal. She exhibits no distension and no mass. There is no tenderness. There is no rebound and no guarding.  Musculoskeletal: She exhibits no edema.  Neurological: She is alert and oriented to person, place, and time.  Skin: Skin is  warm and dry. No rash noted. No erythema. No pallor.     Psychiatric: She has a normal mood and affect.   Assessment and Plan :   Anxiety  Lipoma of neck - Plan: Ambulatory referral to Dermatology  Neck pain  Flexural eczema  Mild persistent chronic asthma without complication  Allergic rhinitis due to other allergic trigger, unspecified seasonality  BMI 40.0-44.9, adult (HCC)  Snoring  PND (paroxysmal nocturnal dyspnea)  Counseled patient regarding management of her anxiety with the maintenance medicine of the class of SSRIs.  I recommended that she restart Zoloft, 50 mg once daily.  Counseled that I would not continue refilling her Xanax without maintenance medication.  Offered her referral to psychiatry if she would like to have a consult to see if they would manage her with Xanax alone for her anxiety.  For now she is agreeable to consider other options besides Xanax including propranolol and Vistaril.  She states that she will restart her Zoloft.  She also plans on following through with behavioral therapy.  Follow-up in 6 weeks.  Regarding her allergic rhinitis, counseled the patient needs to start Zyrtec and Singulair.  If she starts to have issues with Flonase, she can stop this and resume after a month.  I prescribed her Kenalog cream for management of her eczema.  Counseled that I do not want her use Kenalog ointment as this is a high potency medication and prefer to manage with medium to high potency topical steroid like Kenalog cream.  For her lipoma, referral to dermatology with Dr. Allyson Sabal is pending.  For her asthma, I refilled her albuterol inhaler.  We discussed possibility of sleep apnea but patient reports that she did a sleep study and was normal.  Patient had questions about cancer screening which I was not able to address today but recommended that we address these items during an annual physical.  I reviewed her Pap smear results that she requested, recommended that she  have this repeated in 2021. A total of 40 minutes were spent face-to-face with the patient during this encounter and over half of that time was spent on discussing treatment options, potential for side effects with medications, counseling and coordination of care.   Jaynee Eagles, PA-C [facility] [phone] 04/27/2018  3:05 PM

## 2018-08-20 ENCOUNTER — Emergency Department (HOSPITAL_COMMUNITY)
Admission: EM | Admit: 2018-08-20 | Discharge: 2018-08-20 | Disposition: A | Discharge status: Home / Self Care | Payer: BC Managed Care – PPO | Attending: Emergency Medicine | Admitting: Emergency Medicine

## 2018-08-20 ENCOUNTER — Emergency Department (HOSPITAL_COMMUNITY): Payer: BC Managed Care – PPO

## 2018-08-20 ENCOUNTER — Encounter (HOSPITAL_COMMUNITY): Payer: Self-pay | Admitting: Emergency Medicine

## 2018-08-20 ENCOUNTER — Other Ambulatory Visit: Payer: Self-pay

## 2018-08-20 ENCOUNTER — Emergency Department (HOSPITAL_COMMUNITY)
Admission: EM | Admit: 2018-08-20 | Discharge: 2018-08-20 | Disposition: A | Discharge status: Home / Self Care | Payer: BC Managed Care – PPO | Source: Home / Self Care | Attending: Emergency Medicine | Admitting: Emergency Medicine

## 2018-08-20 ENCOUNTER — Encounter (HOSPITAL_COMMUNITY): Payer: Self-pay

## 2018-08-20 DIAGNOSIS — J45909 Unspecified asthma, uncomplicated: Secondary | ICD-10-CM | POA: Insufficient documentation

## 2018-08-20 DIAGNOSIS — Z79899 Other long term (current) drug therapy: Secondary | ICD-10-CM | POA: Insufficient documentation

## 2018-08-20 DIAGNOSIS — R002 Palpitations: Secondary | ICD-10-CM | POA: Diagnosis present

## 2018-08-20 DIAGNOSIS — R0602 Shortness of breath: Secondary | ICD-10-CM | POA: Insufficient documentation

## 2018-08-20 DIAGNOSIS — F329 Major depressive disorder, single episode, unspecified: Secondary | ICD-10-CM | POA: Insufficient documentation

## 2018-08-20 DIAGNOSIS — I10 Essential (primary) hypertension: Secondary | ICD-10-CM | POA: Insufficient documentation

## 2018-08-20 DIAGNOSIS — F419 Anxiety disorder, unspecified: Secondary | ICD-10-CM | POA: Insufficient documentation

## 2018-08-20 HISTORY — DX: Essential (primary) hypertension: I10

## 2018-08-20 LAB — COMPREHENSIVE METABOLIC PANEL
ALK PHOS: 41 U/L (ref 38–126)
ALT: 20 U/L (ref 0–44)
ANION GAP: 7 (ref 5–15)
AST: 19 U/L (ref 15–41)
Albumin: 3.9 g/dL (ref 3.5–5.0)
BILIRUBIN TOTAL: 0.6 mg/dL (ref 0.3–1.2)
BUN: 12 mg/dL (ref 6–20)
CALCIUM: 8.9 mg/dL (ref 8.9–10.3)
CO2: 25 mmol/L (ref 22–32)
Chloride: 108 mmol/L (ref 98–111)
Creatinine, Ser: 0.76 mg/dL (ref 0.44–1.00)
GFR calc Af Amer: >60 mL/min (ref >60)
GFR calc non Af Amer: >60 mL/min (ref >60)
Glucose, Bld: 107 mg/dL — ABNORMAL HIGH (ref 70–99)
POTASSIUM: 3.6 mmol/L (ref 3.5–5.1)
Sodium: 140 mmol/L (ref 135–145)
Total Protein: 7.2 g/dL (ref 6.5–8.1)

## 2018-08-20 LAB — CBC WITH DIFFERENTIAL/PLATELET
Abs Immature Granulocytes: 0.01 10*3/uL (ref 0.00–0.07)
Basophils Absolute: 0 10*3/uL (ref 0.0–0.1)
Basophils Relative: 1 %
EOS ABS: 0.4 10*3/uL (ref 0.0–0.5)
EOS PCT: 7 %
HEMATOCRIT: 41.9 % (ref 36.0–46.0)
Hemoglobin: 12.6 g/dL (ref 12.0–15.0)
Immature Granulocytes: 0 %
LYMPHS ABS: 2.4 10*3/uL (ref 0.7–4.0)
Lymphocytes Relative: 41 %
MCH: 26.8 pg (ref 26.0–34.0)
MCHC: 30.1 g/dL (ref 30.0–36.0)
MCV: 89.1 fL (ref 80.0–100.0)
MONOS PCT: 9 %
Monocytes Absolute: 0.5 10*3/uL (ref 0.1–1.0)
Neutro Abs: 2.4 10*3/uL (ref 1.7–7.7)
Neutrophils Relative %: 42 %
Platelets: 338 10*3/uL (ref 150–400)
RBC: 4.7 MIL/uL (ref 3.87–5.11)
RDW: 13.1 % (ref 11.5–15.5)
WBC: 5.8 10*3/uL (ref 4.0–10.5)
nRBC: 0 % (ref 0.0–0.2)

## 2018-08-20 LAB — I-STAT TROPONIN, ED: Troponin i, poc: 0 ng/mL (ref 0.00–0.08)

## 2018-08-20 LAB — D-DIMER, QUANTITATIVE (NOT AT ARMC): D DIMER QUANT: 0.41 ug{FEU}/mL (ref 0.00–0.50)

## 2018-08-20 LAB — TSH: TSH: 1.868 u[IU]/mL (ref 0.350–4.500)

## 2018-08-20 MED ORDER — ALBUTEROL SULFATE (2.5 MG/3ML) 0.083% IN NEBU: 5.0000 mg | INHALATION_SOLUTION | Freq: Once | RESPIRATORY_TRACT | Status: DC

## 2018-08-20 MED ORDER — AMLODIPINE BESYLATE 5 MG PO TABS
5.0000 mg | ORAL_TABLET | Freq: Once | ORAL | Status: AC
  Administered 2018-08-20: 5 mg via ORAL
  Filled 2018-08-20: qty 1

## 2018-08-20 MED ORDER — AMLODIPINE BESYLATE 5 MG PO TABS: 5.0000 mg | ORAL_TABLET | Freq: Every day | ORAL | 0 refills | Status: DC

## 2018-08-20 NOTE — ED Provider Notes (Signed)
[facility] DEPT Provider Note   CSN: [id number] Arrival date & time: 08/20/18  2013     History   Chief Complaint Chief Complaint  Patient presents with  . Hypertension  . Palpitations  . Headache    HPI Victoria Ewing is a 42 y.o. female.  42 year old female with remote history of hypertension who is currently not on medications presents with palpitations with associated dyspnea and high blood pressure at home.  Seen here earlier today and had a negative chest x-ray as well as EKG.  States that for the past month since she had a lipoma removed from her back she has had periods of dyspnea.  Denies any associated fever cough, congestion.  No anginal quality to it.  Today she had palpitations which occurred at rest.  She denies any use of stimulants at this time.  No recent history of blood loss.  Symptoms are nonexertional.  No treatment used prior to arrival     Past Medical History:  Diagnosis Date  . Allergy   . Anxiety   . Asthma   . Depression   . Hypertension     Patient Active Problem List   Diagnosis Date Noted  . Acute pharyngitis 10/31/2017  . Sore throat 10/31/2017  . Exposure to strep throat 10/31/2017  . Exposure to influenza 10/31/2017  . Cough 08/05/2017  . Fever 08/05/2017  . Lower respiratory infection 08/05/2017  . BMI 40.0-44.9, adult ([place]) 03/05/2017  . Eczema 12/14/2014  . Asthma, chronic 08/30/2014  . Anxiety state 07/22/2013  . Allergic rhinitis 07/22/2013    Past Surgical History:  Procedure Laterality Date  . CESAREAN SECTION    . lipoma removal       OB History   None      Home Medications    Prior to Admission medications   Medication Sig Start Date End Date Taking? Authorizing Provider  albuterol (PROVENTIL HFA;VENTOLIN HFA) 108 (90 Base) MCG/ACT inhaler Inhale 2 puffs into the lungs every 6 (six) hours as needed for wheezing. 04/27/18   Jaynee Eagles, PA-C  ALPRAZolam Duanne Moron) 0.25 MG  tablet TAKE 1 TO 1 & 1/2 TABLETS ONCE DAILY AS NEEDED. 04/27/18   Jaynee Eagles, PA-C  cetirizine (ZYRTEC) 10 MG tablet Take 1 tablet (10 mg total) by mouth daily. 04/27/18   Jaynee Eagles, PA-C  cholecalciferol (VITAMIN D) 1000 UNITS tablet Take 2,000 Units by mouth daily. Reported on 02/08/2016    [provider]  fluticasone (FLONASE) 50 MCG/ACT nasal spray Place 2 sprays into both nostrils daily. 12/31/16   Forrest Moron, MD  hydrocortisone 2.5 % cream Apply topically 2 (two) times daily.    [provider]  montelukast (SINGULAIR) 10 MG tablet Take 1 tablet (10 mg total) by mouth at bedtime. 04/27/18   Jaynee Eagles, PA-C  sertraline (ZOLOFT) 50 MG tablet Take 1 tablet (50 mg total) by mouth daily. 04/27/18   Jaynee Eagles, PA-C  triamcinolone cream (KENALOG) 0.1 % Apply 1 application topically 2 (two) times daily. 04/27/18   Jaynee Eagles, PA-C    Family History Family History  Problem Relation Age of Onset  . Cancer Mother        pancreatic  . Stroke Mother 16  . Diabetes Mother   . Kidney disease Mother        transplant in 2010  . Hypertension Mother   . Transient ischemic attack Father 64  . Cancer Father        prostate  cancer?  . Stroke Maternal Grandmother 88  . Hypertension Sister 108  . Stroke Cousin 45  . Rheum arthritis Cousin     Social History Social History   Tobacco Use  . Smoking status: Never Smoker  . Smokeless tobacco: Never Used  Substance Use Topics  . Alcohol use: Yes    Alcohol/week: 0.0 standard drinks  . Drug use: No     Allergies   Other and Effexor xr [venlafaxine hcl er]   Review of Systems Review of Systems  All other systems reviewed and are negative.    Physical Exam Updated Vital Signs BP (!) 161/95 (BP Location: Left Arm)   Pulse 89   Temp 98.8 F (37.1 C) (Oral)   Resp 16   LMP 08/13/2018 (Approximate)   SpO2 100%   Physical Exam  Constitutional: She is oriented to person, place, and time. She appears  well-developed and well-nourished.  Non-toxic appearance. No distress.  HENT:  Head: Normocephalic and atraumatic.  Eyes: Pupils are equal, round, and reactive to light. Conjunctivae, EOM and lids are normal.  Neck: Normal range of motion. Neck supple. No tracheal deviation present. No thyroid mass present.  Cardiovascular: Normal rate, regular rhythm and normal heart sounds. Exam reveals no gallop.  No murmur heard. Pulmonary/Chest: Effort normal and breath sounds normal. No stridor. No respiratory distress. She has no decreased breath sounds. She has no wheezes. She has no rhonchi. She has no rales.  Abdominal: Soft. Normal appearance and bowel sounds are normal. She exhibits no distension. There is no tenderness. There is no rebound and no CVA tenderness.  Musculoskeletal: Normal range of motion. She exhibits no edema or tenderness.  Neurological: She is alert and oriented to person, place, and time. She has normal strength. No cranial nerve deficit or sensory deficit. GCS eye subscore is 4. GCS verbal subscore is 5. GCS motor subscore is 6.  Skin: Skin is warm and dry. No abrasion and no rash noted.  Psychiatric: She has a normal mood and affect. Her speech is normal and behavior is normal.  Nursing note and vitals reviewed.    ED Treatments / Results  Labs (all labs ordered are listed, but only abnormal results are displayed) Labs Reviewed  CBC WITH DIFFERENTIAL/PLATELET  COMPREHENSIVE METABOLIC PANEL  TSH    EKG None  Radiology Dg Chest 2 View  Result Date: 08/20/2018 CLINICAL DATA:  Short of breath EXAM: CHEST - 2 VIEW COMPARISON:  None. FINDINGS: Mild cardiac enlargement without heart failure. Lungs are clear without infiltrate or effusion. No mass lesion. No skeletal abnormality. IMPRESSION: No active cardiopulmonary disease.  Mild cardiac enlargement Electronically Signed   By: Franchot Gallo M.D.   On: 08/20/2018 12:50    Procedures Procedures (including critical care  time)  Medications Ordered in ED Medications  amLODipine (NORVASC) tablet 5 mg (has no administration in time range)   ED ECG REPORT   Date: 08/20/2018  Rate: 79  Rhythm: normal sinus rhythm  QRS Axis: normal  Intervals: normal  ST/T Wave abnormalities: nonspecific ST changes  Conduction Disutrbances:none  Narrative Interpretation:   Old EKG Reviewed: unchanged  I have personally reviewed the EKG tracing and agree with the computerized printout as noted.   Initial Impression / Assessment and Plan / ED Course  I have reviewed the triage vital signs and the nursing notes.  Pertinent labs & imaging results that were available during my care of the patient were reviewed by me and considered in my medical decision  making (see chart for details).     Patient given oral dose of amlodipine here and blood pressures responded nicely.  Work appears reassuring.  Has scheduled appointment with her doctor next week and will prescribe Norvasc  Final Clinical Impressions(s) / ED Diagnoses   Final diagnoses:  None    ED Discharge Orders    None       Lacretia Leigh, MD 08/20/18 2309

## 2018-08-20 NOTE — ED Triage Notes (Signed)
Patient c/o SOB and feeling like her heart was racing at [place] today. Patient reports a history of anxiety and states that she has not had her anxiety meds x 2 days and a family history of atrial fib. Patient states she used her Albuterol inhaler with no relief from SOB.

## 2018-08-20 NOTE — ED Triage Notes (Signed)
Patient states that she was seen earlier for palpitations and hypertension. Wants to be evaluated again. States "I don't think I had proper treatment". "I would like to know what's wrong with me". Hx of anxiety.

## 2018-08-22 NOTE — ED Provider Notes (Signed)
[facility] DEPT Provider Note   CSN: [id number] Arrival date & time: 08/20/18  1207     History   Chief Complaint Chief Complaint  Patient presents with  . Shortness of Breath  . Anxiety    HPI Victoria Ewing is a 42 y.o. female.  HPI   42 year old female with palpitations.  Onset initially around 715 this morning while in her car.  She felt like her heart was racing and "beating hard."  Symptoms resolved but then returned again while at work.  She was again at rest when symptoms started.  It did make her feel short of breath this time as well.  Tried her albuterol inhaler with no improvement.  Denies any chest pain.  No fevers or chills.  No cough.  No unusual leg pain or swelling.  She was in her usual state of health when she woke up this morning.  Denies significant caffeine usage.  Past Medical History:  Diagnosis Date  . Allergy   . Anxiety   . Asthma   . Depression   . Hypertension     Patient Active Problem List   Diagnosis Date Noted  . Acute pharyngitis 10/31/2017  . Sore throat 10/31/2017  . Exposure to strep throat 10/31/2017  . Exposure to influenza 10/31/2017  . Cough 08/05/2017  . Fever 08/05/2017  . Lower respiratory infection 08/05/2017  . BMI 40.0-44.9, adult ([place]) 03/05/2017  . Eczema 12/14/2014  . Asthma, chronic 08/30/2014  . Anxiety state 07/22/2013  . Allergic rhinitis 07/22/2013    Past Surgical History:  Procedure Laterality Date  . CESAREAN SECTION    . lipoma removal       OB History   None      Home Medications    Prior to Admission medications   Medication Sig Start Date End Date Taking? Authorizing Provider  albuterol (PROVENTIL HFA;VENTOLIN HFA) 108 (90 Base) MCG/ACT inhaler Inhale 2 puffs into the lungs every 6 (six) hours as needed for wheezing. 04/27/18   Jaynee Eagles, PA-C  ALPRAZolam (XANAX) 0.25 MG tablet TAKE 1 TO 1 & 1/2 TABLETS ONCE DAILY AS NEEDED. Patient taking differently:  Take 0.25 mg by mouth daily as needed for anxiety.  04/27/18   Jaynee Eagles, PA-C  amLODipine (NORVASC) 5 MG tablet Take 1 tablet (5 mg total) by mouth daily. 08/20/18   Lacretia Leigh, MD  cetirizine (ZYRTEC) 10 MG tablet Take 1 tablet (10 mg total) by mouth daily. Patient not taking: Reported on 08/20/2018 04/27/18   Jaynee Eagles, PA-C  fluticasone [facility]) 50 MCG/ACT nasal spray Place 2 sprays into both nostrils daily. Patient not taking: Reported on 08/20/2018 12/31/16   Forrest Moron, MD  montelukast (SINGULAIR) 10 MG tablet Take 1 tablet (10 mg total) by mouth at bedtime. Patient not taking: Reported on 08/20/2018 04/27/18   Jaynee Eagles, PA-C  sertraline (ZOLOFT) 50 MG tablet Take 1 tablet (50 mg total) by mouth daily. Patient not taking: Reported on 08/20/2018 04/27/18   Jaynee Eagles, PA-C  triamcinolone cream (KENALOG) 0.1 % Apply 1 application topically 2 (two) times daily. Patient not taking: Reported on 08/20/2018 04/27/18   Jaynee Eagles, PA-C    Family History Family History  Problem Relation Age of Onset  . Cancer Mother        pancreatic  . Stroke Mother 72  . Diabetes Mother   . Kidney disease Mother        transplant in 2010  . Hypertension Mother   .  Transient ischemic attack Father 53  . Cancer Father        prostate cancer?  . Stroke Maternal Grandmother 82  . Hypertension Sister 53  . Stroke Cousin 45  . Rheum arthritis Cousin     Social History Social History   Tobacco Use  . Smoking status: Never Smoker  . Smokeless tobacco: Never Used  Substance Use Topics  . Alcohol use: Yes    Alcohol/week: 0.0 standard drinks  . Drug use: No     Allergies   Other and Effexor xr [venlafaxine hcl er]   Review of Systems Review of Systems  All systems reviewed and negative, other than as noted in HPI.  Physical Exam Updated Vital Signs BP (!) 141/93   Pulse 72   Temp 98.3 F (36.8 C) (Oral)   Resp 17   Ht 5\' 10"  (1.778 m)   Wt 136.1 kg   LMP 08/13/2018  (Approximate)   SpO2 100%   BMI 43.05 kg/m   Physical Exam  Constitutional: She appears well-developed and well-nourished. No distress.  HENT:  Head: Normocephalic and atraumatic.  Eyes: Conjunctivae are normal. Right eye exhibits no discharge. Left eye exhibits no discharge.  Neck: Neck supple.  Cardiovascular: Normal rate, regular rhythm and normal heart sounds. Exam reveals no gallop and no friction rub.  No murmur heard. Pulmonary/Chest: Effort normal and breath sounds normal. No respiratory distress.  Abdominal: Soft. She exhibits no distension. There is no tenderness.  Musculoskeletal: She exhibits no edema or tenderness.  Lower extremities symmetric as compared to each other. No calf tenderness. Negative Homan's. No palpable cords.   Neurological: She is alert.  Skin: Skin is warm and dry.  Psychiatric: She has a normal mood and affect. Her behavior is normal. Thought content normal.  Nursing note and vitals reviewed.    ED Treatments / Results  Labs (all labs ordered are listed, but only abnormal results are displayed) Labs Reviewed - No data to display  EKG EKG Interpretation  Date/Time:  Friday August 20 2018 12:19:35 EST Ventricular Rate:  83 PR Interval:    QRS Duration: 97 QT Interval:  396 QTC Calculation: 466 R Axis:   79 Text Interpretation:  Sinus rhythm Confirmed by Virgel Manifold [phone]) on 08/20/2018 1:11:15 PM   Radiology No results found.   Dg Chest 2 View  Result Date: 08/20/2018 CLINICAL DATA:  Short of breath EXAM: CHEST - 2 VIEW COMPARISON:  None. FINDINGS: Mild cardiac enlargement without heart failure. Lungs are clear without infiltrate or effusion. No mass lesion. No skeletal abnormality. IMPRESSION: No active cardiopulmonary disease.  Mild cardiac enlargement Electronically Signed   By: Franchot Gallo M.D.   On: 08/20/2018 12:50    Procedures Procedures (including critical care time)  Medications Ordered in ED Medications - No  data to display   Initial Impression / Assessment and Plan / ED Course  I have reviewed the triage vital signs and the nursing notes.  Pertinent labs & imaging results that were available during my care of the patient were reviewed by me and considered in my medical decision making (see chart for details).     42 year old female with palpitations.  I have a low suspicion for emergent etiology.  She had continued symptoms while I was in the room with her but remained in a sinus rhythm at 70-80s during this. I did not appreciate any ectopy on monitor or during auscultation. Pt insistent on finding an exact explanation for her symptoms. I acknowledged  her symptoms but tried to reassure her that I have a low suspicion for a serious medical process. I do not feel further testing is necessary nor will it be helpful. I advised discussing possible holter monitoring with her PCP and to also keep a log of her BP to take with her to her next appointment.   Final Clinical Impressions(s) / ED Diagnoses   Final diagnoses:  Palpitations    ED Discharge Orders    None       Virgel Manifold, MD 08/22/18 [phone]

## 2018-08-24 ENCOUNTER — Ambulatory Visit: Payer: BC Managed Care – PPO | Admitting: Family Medicine

## 2018-09-15 ENCOUNTER — Other Ambulatory Visit: Payer: Self-pay

## 2018-09-15 ENCOUNTER — Ambulatory Visit: Payer: BC Managed Care – PPO | Admitting: Family Medicine

## 2018-09-15 ENCOUNTER — Encounter

## 2018-09-15 ENCOUNTER — Encounter: Payer: Self-pay | Admitting: Family Medicine

## 2018-09-15 VITALS — BP 132/89 | HR 66 | Temp 97.7°F | Resp 16 | Ht 70.0 in | Wt 306.8 lb

## 2018-09-15 DIAGNOSIS — J322 Chronic ethmoidal sinusitis: Secondary | ICD-10-CM

## 2018-09-15 DIAGNOSIS — Z23 Encounter for immunization: Secondary | ICD-10-CM

## 2018-09-15 DIAGNOSIS — I10 Essential (primary) hypertension: Secondary | ICD-10-CM | POA: Diagnosis not present

## 2018-09-15 MED ORDER — AMLODIPINE BESYLATE 5 MG PO TABS: 5.0000 mg | ORAL_TABLET | Freq: Every day | ORAL | 3 refills | Status: DC

## 2018-09-15 NOTE — Progress Notes (Signed)
Established Patient Office Visit  Subjective:  Patient ID: Victoria Ewing, female    DOB: 1976/05/20  Age: 42 y.o. MRN: [id number]  CC:  Chief Complaint  Patient presents with  . Follow-up  . constant ha's    x 1 1/2 weeks,  taking advil and it doesn't touch.  . Medication Refill    cetirizine, fluticasone    HPI ZABELLA WEASE presents for   Sinus headache She reports that she has been having a headache around her nose and eyes No vision changes She reports that the headaches ae becoming constant and alleve is not helping She is here for flonase and zyrtec refill and referral to ENT   Hypertension: Patient here for follow-up of elevated blood pressure. She is not exercising and is not adherent to low salt diet.  Blood pressure is not well controlled at home. Cardiac symptoms none. Patient denies chest pressure/discomfort, claudication, dyspnea, exertional chest pressure/discomfort, fatigue, irregular heart beat, lower extremity edema and near-syncope.  Cardiovascular risk factors: hypertension, obesity (BMI >= 30 kg/m2) and sedentary lifestyle. Use of agents associated with hypertension: NSAIDS. History of target organ damage: none. She has been out of the amlodipine. She reports that some of her home readings have been in the 140s  Past Medical History:  Diagnosis Date  . Allergy   . Anxiety   . Asthma   . Depression   . Hypertension     Past Surgical History:  Procedure Laterality Date  . CESAREAN SECTION    . lipoma removal      Family History  Problem Relation Age of Onset  . Cancer Mother        pancreatic  . Stroke Mother 16  . Diabetes Mother   . Kidney disease Mother        transplant in 2010  . Hypertension Mother   . Transient ischemic attack Father 61  . Cancer Father        prostate cancer?  . Stroke Maternal Grandmother 75  . Hypertension Sister 84  . Stroke Cousin 45  . Rheum arthritis Cousin     Social History   Socioeconomic  History  . Marital status: Single    Spouse name: Not on file  . Number of children: Not on file  . Years of education: Not on file  . Highest education level: Not on file  Occupational History  . Not on file  Social Needs  . Financial resource strain: Not on file  . Food insecurity:    Worry: Not on file    Inability: Not on file  . Transportation needs:    Medical: Not on file    Non-medical: Not on file  Tobacco Use  . Smoking status: Never Smoker  . Smokeless tobacco: Never Used  Substance and Sexual Activity  . Alcohol use: Yes    Alcohol/week: 0.0 standard drinks  . Drug use: No  . Sexual activity: Not Currently  Lifestyle  . Physical activity:    Days per week: Not on file    Minutes per session: Not on file  . Stress: Not on file  Relationships  . Social connections:    Talks on phone: Not on file    Gets together: Not on file    Attends religious service: Not on file    Active member of club or organization: Not on file    Attends meetings of clubs or organizations: Not on file    Relationship status: Not on  file  . Intimate partner violence:    Fear of current or ex partner: Not on file    Emotionally abused: Not on file    Physically abused: Not on file    Forced sexual activity: Not on file  Other Topics Concern  . Not on file  Social History Narrative  . Not on file    Outpatient Medications Prior to Visit  Medication Sig Dispense Refill  . albuterol (PROVENTIL HFA;VENTOLIN HFA) 108 (90 Base) MCG/ACT inhaler Inhale 2 puffs into the lungs every 6 (six) hours as needed for wheezing. 1 Inhaler 1  . ALPRAZolam (XANAX) 0.25 MG tablet TAKE 1 TO 1 & 1/2 TABLETS ONCE DAILY AS NEEDED. (Patient taking differently: Take 0.25 mg by mouth daily as needed for anxiety. ) 45 tablet 0  . cetirizine (ZYRTEC) 10 MG tablet Take 1 tablet (10 mg total) by mouth daily. 90 tablet 3  . fluticasone (FLONASE) 50 MCG/ACT nasal spray Place 2 sprays into both nostrils daily. 16 g  6  . montelukast (SINGULAIR) 10 MG tablet Take 1 tablet (10 mg total) by mouth at bedtime. 90 tablet 1  . triamcinolone cream (KENALOG) 0.1 % Apply 1 application topically 2 (two) times daily. 30 g 5  . amLODipine (NORVASC) 5 MG tablet Take 1 tablet (5 mg total) by mouth daily. 30 tablet 0  . sertraline (ZOLOFT) 50 MG tablet Take 1 tablet (50 mg total) by mouth daily. (Patient not taking: Reported on 08/20/2018) 90 tablet 1   No facility-administered medications prior to visit.     Allergies  Allergen Reactions  . Other Anaphylaxis    Pecans and nuts   . Effexor Xr [Venlafaxine Hcl Er]     ROS Review of Systems Review of Systems  Constitutional: Negative for activity change, appetite change, chills and fever.  HENT: Negative for congestion, nosebleeds, trouble swallowing and voice change.   Respiratory: Negative for cough, shortness of breath and wheezing.   Gastrointestinal: Negative for diarrhea, nausea and vomiting.  Genitourinary: Negative for difficulty urinating, dysuria, flank pain and hematuria.  Musculoskeletal: Negative for back pain, joint swelling and neck pain.  Neurological: Negative for dizziness, speech difficulty, light-headedness and numbness.  See HPI. All other review of systems negative.     Objective:    Physical Exam  BP 132/89 (BP Location: Right Arm, Patient Position: Sitting, Cuff Size: Large)   Pulse 66   Temp 97.7 F (36.5 C) (Oral)   Resp 16   Ht 5\' 10"  (1.778 m)   Wt (!) 306 lb 12.8 oz (139.2 kg)   LMP 09/06/2018   SpO2 100%   BMI 44.02 kg/m  Wt Readings from Last 3 Encounters:  09/15/18 (!) 306 lb 12.8 oz (139.2 kg)  08/20/18 300 lb (136.1 kg)  04/27/18 (!) 309 lb (140.2 kg)   General: alert, oriented, in NAD Head: normocephalic, atraumatic, + frontal and ethmoid sinus tenderness Eyes: EOM intact, no scleral icterus or conjunctival injection Ears: TM clear bilaterally Nose: mucosa nonerythematous, nonedematous Throat: no pharyngeal  exudate or erythema Lymph: no posterior auricular, submental or cervical lymph adenopathy Heart: normal rate, normal sinus rhythm, no murmurs Lungs: clear to auscultation bilaterally, no wheezing    There are no preventive care reminders to display for this patient.  There are no preventive care reminders to display for this patient.  Lab Results  Component Value Date   TSH 1.868 08/20/2018   Lab Results  Component Value Date   WBC 5.8 08/20/2018  HGB 12.6 08/20/2018   HCT 41.9 08/20/2018   MCV 89.1 08/20/2018   PLT 338 08/20/2018   Lab Results  Component Value Date   NA 140 08/20/2018   K 3.6 08/20/2018   CO2 25 08/20/2018   GLUCOSE 107 (H) 08/20/2018   BUN 12 08/20/2018   CREATININE 0.76 08/20/2018   BILITOT 0.6 08/20/2018   ALKPHOS 41 08/20/2018   AST 19 08/20/2018   ALT 20 08/20/2018   PROT 7.2 08/20/2018   ALBUMIN 3.9 08/20/2018   CALCIUM 8.9 08/20/2018   ANIONGAP 7 08/20/2018   Lab Results  Component Value Date   CHOL 136 08/11/2016   Lab Results  Component Value Date   HDL 47 (L) 08/11/2016   Lab Results  Component Value Date   LDLCALC 64 08/11/2016   Lab Results  Component Value Date   TRIG 127 08/11/2016   Lab Results  Component Value Date   CHOLHDL 2.9 08/11/2016   Lab Results  Component Value Date   HGBA1C 5.4 08/11/2016      Assessment & Plan:   Problem List Items Addressed This Visit    None    Visit Diagnoses    Chronic ethmoidal sinusitis    -  Primary Advised allergy meds and follow up with ENT   Relevant Orders   Ambulatory referral to ENT   Flu vaccine need       Relevant Orders   Flu Vaccine QUAD 36+ mos IM (Completed)   Essential hypertension    -  Resume amlodipine but advised weight loss and exercise as well as DASH diet   Relevant Medications   amLODipine (NORVASC) 5 MG tablet     ADVISED FLU vaccines Follow up with ENT  She should take her Singulair   Meds ordered this encounter  Medications  .  amLODipine (NORVASC) 5 MG tablet    Sig: Take 1 tablet (5 mg total) by mouth daily.    Dispense:  30 tablet    Refill:  3    Follow-up: Return in about 3 months (around 12/15/2018) for hypertension.    Forrest Moron, MD

## 2018-09-15 NOTE — Patient Instructions (Addendum)
   If you have lab work done today you will be contacted with your lab results within the next 2 weeks.  If you have not heard from us then please contact us. The fastest way to get your results is to register for My Chart.   IF you received an x-ray today, you will receive an invoice from [facility]. Please contact [facility] at [phone] with questions or concerns regarding your invoice.   IF you received labwork today, you will receive an invoice from [facility]. Please contact [facility] at [phone] with questions or concerns regarding your invoice.   Our billing staff will not be able to assist you with questions regarding bills from these companies.  You will be contacted with the lab results as soon as they are available. The fastest way to get your results is to activate your My Chart account. Instructions are located on the last page of this paperwork. If you have not heard from us regarding the results in 2 weeks, please contact this office.      DASH Eating Plan DASH stands for "Dietary Approaches to Stop Hypertension." The DASH eating plan is a healthy eating plan that has been shown to reduce high blood pressure (hypertension). It may also reduce your risk for type 2 diabetes, heart disease, and stroke. The DASH eating plan may also help with weight loss. What are tips for following this plan? General guidelines  Avoid eating more than 2,300 mg (milligrams) of salt (sodium) a day. If you have hypertension, you may need to reduce your sodium intake to 1,500 mg a day.  Limit alcohol intake to no more than 1 drink a day for nonpregnant women and 2 drinks a day for men. One drink equals 12 oz of beer, 5 oz of wine, or 1 oz of hard liquor.  Work with your health care provider to maintain a healthy body weight or to lose weight. Ask what an ideal weight is for you.  Get at least 30 minutes of exercise that causes your heart to beat faster (aerobic  exercise) most days of the week. Activities may include walking, swimming, or biking.  Work with your health care provider or diet and nutrition specialist (dietitian) to adjust your eating plan to your individual calorie needs. Reading food labels  Check food labels for the amount of sodium per serving. Choose foods with less than 5 percent of the Daily Value of sodium. Generally, foods with less than 300 mg of sodium per serving fit into this eating plan.  To find whole grains, look for the word "whole" as the first word in the ingredient list. Shopping  Buy products labeled as "low-sodium" or "no salt added."  Buy fresh foods. Avoid canned foods and premade or frozen meals. Cooking  Avoid adding salt when cooking. Use salt-free seasonings or herbs instead of table salt or sea salt. Check with your health care provider or pharmacist before using salt substitutes.  Do not fry foods. Cook foods using healthy methods such as baking, boiling, grilling, and broiling instead.  Cook with heart-healthy oils, such as olive, canola, soybean, or sunflower oil. Meal planning   Eat a balanced diet that includes: ? 5 or more servings of fruits and vegetables each day. At each meal, try to fill half of your plate with fruits and vegetables. ? Up to 6-8 servings of whole grains each day. ? Less than 6 oz of lean meat, poultry, or fish each day. A 3-oz serving   of meat is about the same size as a deck of cards. One egg equals 1 oz. ? 2 servings of low-fat dairy each day. ? A serving of nuts, seeds, or beans 5 times each week. ? Heart-healthy fats. Healthy fats called Omega-3 fatty acids are found in foods such as flaxseeds and coldwater fish, like sardines, salmon, and mackerel.  Limit how much you eat of the following: ? Canned or prepackaged foods. ? Food that is high in trans fat, such as fried foods. ? Food that is high in saturated fat, such as fatty meat. ? Sweets, desserts, sugary drinks,  and other foods with added sugar. ? Full-fat dairy products.  Do not salt foods before eating.  Try to eat at least 2 vegetarian meals each week.  Eat more home-cooked food and less restaurant, buffet, and fast food.  When eating at a restaurant, ask that your food be prepared with less salt or no salt, if possible. What foods are recommended? The items listed may not be a complete list. Talk with your dietitian about what dietary choices are best for you. Grains Whole-grain or whole-wheat bread. Whole-grain or whole-wheat pasta. Brown rice. Oatmeal. Quinoa. Bulgur. Whole-grain and low-sodium cereals. Pita bread. Low-fat, low-sodium crackers. Whole-wheat flour tortillas. Vegetables Fresh or frozen vegetables (raw, steamed, roasted, or grilled). Low-sodium or reduced-sodium tomato and vegetable juice. Low-sodium or reduced-sodium tomato sauce and tomato paste. Low-sodium or reduced-sodium canned vegetables. Fruits All fresh, dried, or frozen fruit. Canned fruit in natural juice (without added sugar). Meat and other protein foods Skinless chicken or turkey. Ground chicken or turkey. Pork with fat trimmed off. Fish and seafood. Egg whites. Dried beans, peas, or lentils. Unsalted nuts, nut butters, and seeds. Unsalted canned beans. Lean cuts of beef with fat trimmed off. Low-sodium, lean deli meat. Dairy Low-fat (1%) or fat-free (skim) milk. Fat-free, low-fat, or reduced-fat cheeses. Nonfat, low-sodium ricotta or cottage cheese. Low-fat or nonfat yogurt. Low-fat, low-sodium cheese. Fats and oils Soft margarine without trans fats. Vegetable oil. Low-fat, reduced-fat, or light mayonnaise and salad dressings (reduced-sodium). Canola, safflower, olive, soybean, and sunflower oils. Avocado. Seasoning and other foods Herbs. Spices. Seasoning mixes without salt. Unsalted popcorn and pretzels. Fat-free sweets. What foods are not recommended? The items listed may not be a complete list. Talk with your  dietitian about what dietary choices are best for you. Grains Baked goods made with fat, such as croissants, muffins, or some breads. Dry pasta or rice meal packs. Vegetables Creamed or fried vegetables. Vegetables in a cheese sauce. Regular canned vegetables (not low-sodium or reduced-sodium). Regular canned tomato sauce and paste (not low-sodium or reduced-sodium). Regular tomato and vegetable juice (not low-sodium or reduced-sodium). Pickles. Olives. Fruits Canned fruit in a light or heavy syrup. Fried fruit. Fruit in cream or butter sauce. Meat and other protein foods Fatty cuts of meat. Ribs. Fried meat. Bacon. Sausage. Bologna and other processed lunch meats. Salami. Fatback. Hotdogs. Bratwurst. Salted nuts and seeds. Canned beans with added salt. Canned or smoked fish. Whole eggs or egg yolks. Chicken or turkey with skin. Dairy Whole or 2% milk, cream, and half-and-half. Whole or full-fat cream cheese. Whole-fat or sweetened yogurt. Full-fat cheese. Nondairy creamers. Whipped toppings. Processed cheese and cheese spreads. Fats and oils Butter. Stick margarine. Lard. Shortening. Ghee. Bacon fat. Tropical oils, such as coconut, palm kernel, or palm oil. Seasoning and other foods Salted popcorn and pretzels. Onion salt, garlic salt, seasoned salt, table salt, and sea salt. Worcestershire sauce. Tartar sauce. Barbecue sauce.   Teriyaki sauce. Soy sauce, including reduced-sodium. Steak sauce. Canned and packaged gravies. Fish sauce. Oyster sauce. Cocktail sauce. Horseradish that you find on the shelf. Ketchup. Mustard. Meat flavorings and tenderizers. Bouillon cubes. Hot sauce and Tabasco sauce. Premade or packaged marinades. Premade or packaged taco seasonings. Relishes. Regular salad dressings. Where to find more information:  National Heart, Lung, and Blood Institute: www.nhlbi.nih.gov  American Heart Association: www.heart.org Summary  The DASH eating plan is a healthy eating plan that has  been shown to reduce high blood pressure (hypertension). It may also reduce your risk for type 2 diabetes, heart disease, and stroke.  With the DASH eating plan, you should limit salt (sodium) intake to 2,300 mg a day. If you have hypertension, you may need to reduce your sodium intake to 1,500 mg a day.  When on the DASH eating plan, aim to eat more fresh fruits and vegetables, whole grains, lean proteins, low-fat dairy, and heart-healthy fats.  Work with your health care provider or diet and nutrition specialist (dietitian) to adjust your eating plan to your individual calorie needs. This information is not intended to replace advice given to you by your health care provider. Make sure you discuss any questions you have with your health care provider. Document Released: 09/11/2011 Document Revised: 09/15/2016 Document Reviewed: 09/15/2016 Elsevier Interactive Patient Education  2018 [employer]vier Inc.  

## 2019-09-17 ENCOUNTER — Other Ambulatory Visit: Payer: Self-pay | Admitting: Family Medicine

## 2019-09-18 NOTE — Telephone Encounter (Signed)
Requested medication (s) are due for refill today: yes  Requested medication (s) are on the active medication list: yes  Last refill:  12/31/2018  Future visit scheduled: yes  Notes to clinic: Patient has upcoming appointment  Review for refill   Requested Prescriptions  Pending Prescriptions Disp Refills   amLODipine (NORVASC) 5 MG tablet [Pharmacy Med Name: AMLODIPINE BESYLATE 5 MG TAB] 30 tablet 3    Sig: TAKE 1 TABLET BY MOUTH EVERY DAY      Cardiovascular:  Calcium Channel Blockers Failed - 09/17/2019 11:51 AM      Failed - Valid encounter within last 6 months    Recent Outpatient Visits           1 year ago Chronic ethmoidal sinusitis   [facility], Arlie Solomons, MD   1 year ago Anxiety   [facility] at [place], [place]   1 year ago Acute pharyngitis, unspecified etiology   [facility], Ines Bloomer, MD   1 year ago Anxiety   [facility] at [place], [place]   2 years ago [facility] [place], [place]       Future Appointments             In 1 month Forrest Moron, MD [facility] [place], [place] BP in normal range    BP Readings from Last 1 Encounters:  09/15/18 132/89

## 2019-09-21 ENCOUNTER — Telehealth: Payer: Self-pay | Admitting: Family Medicine

## 2019-09-21 NOTE — Telephone Encounter (Signed)
ALPRAZolam (XANAX) 0.25 MG tablet [id number]   Request complimentary refill   Medication refill has been scheduled

## 2019-09-22 NOTE — Telephone Encounter (Signed)
Patient do have an upcoming appt on 09/28/19

## 2019-09-28 ENCOUNTER — Other Ambulatory Visit: Payer: Self-pay

## 2019-09-28 ENCOUNTER — Encounter: Payer: Self-pay | Admitting: Family Medicine

## 2019-09-28 ENCOUNTER — Telehealth (INDEPENDENT_AMBULATORY_CARE_PROVIDER_SITE_OTHER): Payer: BC Managed Care – PPO | Admitting: Family Medicine

## 2019-09-28 DIAGNOSIS — D229 Melanocytic nevi, unspecified: Secondary | ICD-10-CM

## 2019-09-28 DIAGNOSIS — F419 Anxiety disorder, unspecified: Secondary | ICD-10-CM | POA: Diagnosis not present

## 2019-09-28 DIAGNOSIS — J320 Chronic maxillary sinusitis: Secondary | ICD-10-CM | POA: Diagnosis not present

## 2019-09-28 MED ORDER — ALPRAZOLAM 0.25 MG PO TABS: ORAL_TABLET | ORAL | 0 refills | Status: DC

## 2019-09-28 NOTE — Progress Notes (Signed)
Telemedicine Encounter- SOAP NOTE Established Patient  This telephone encounter was conducted with the patient's (or proxy's) verbal consent via audio telecommunications: yes/no: Yes Patient was instructed to have this encounter in a suitably private space; and to only have persons present to whom they give permission to participate. In addition, patient identity was confirmed by use of name plus two identifiers (DOB and address).  I discussed the limitations, risks, security and privacy concerns of performing an evaluation and management service by telephone and the availability of in person appointments. I also discussed with the patient that there may be a patient responsible charge related to this service. The patient expressed understanding and agreed to proceed.  I spent a total of TIME; 0 MIN TO 60 MIN: 15 minutes talking with the patient or their proxy.  Chief complaint: referrals, anxiety Subjective   Victoria Ewing is a 43 y.o. established patient. Telephone visit today for  HPI  Mole on the right buttock and is changing character and texture She had it evaluated by dermatology in the past She has no family history or personal history of skin cancer.  Patient reports that she has chronic sinusitis She states that she was referred but covid started and the refill expired She states that    She reports that she has been having increasing stress from covid She does not normally take xanax She states that she feels like she is overwhelmed at times She denies panic attacks but feels like she gets very tense and stressed She works in the school   Patient Active Problem List   Diagnosis Date Noted  . Acute pharyngitis 10/31/2017  . Sore throat 10/31/2017  . Exposure to strep throat 10/31/2017  . Exposure to influenza 10/31/2017  . Cough 08/05/2017  . Fever 08/05/2017  . Lower respiratory infection 08/05/2017  . BMI 40.0-44.9, adult ([place]) 03/05/2017  . Eczema  12/14/2014  . Asthma, chronic 08/30/2014  . Anxiety state 07/22/2013  . Allergic rhinitis 07/22/2013    Past Medical History:  Diagnosis Date  . Allergy   . Anxiety   . Asthma   . Depression   . Hypertension     Current Outpatient Medications  Medication Sig Dispense Refill  . albuterol (PROVENTIL HFA;VENTOLIN HFA) 108 (90 Base) MCG/ACT inhaler Inhale 2 puffs into the lungs every 6 (six) hours as needed for wheezing. 1 Inhaler 1  . amLODipine (NORVASC) 5 MG tablet TAKE 1 TABLET BY MOUTH EVERY DAY 30 tablet 3  . cetirizine (ZYRTEC) 10 MG tablet Take 1 tablet (10 mg total) by mouth daily. 90 tablet 3  . triamcinolone cream (KENALOG) 0.1 % Apply 1 application topically 2 (two) times daily. 30 g 5  . ALPRAZolam (XANAX) 0.25 MG tablet TAKE 1 TO 1 & 1/2 TABLETS ONCE DAILY AS NEEDED. 45 tablet 0  . fluticasone (FLONASE) 50 MCG/ACT nasal spray Place 2 sprays into both nostrils daily. (Patient not taking: Reported on 09/28/2019) 16 g 6   No current facility-administered medications for this visit.    Allergies  Allergen Reactions  . Other Anaphylaxis    Pecans and nuts   . Effexor Xr [Venlafaxine Hcl Er]     Social History   Socioeconomic History  . Marital status: Single    Spouse name: Not on file  . Number of children: Not on file  . Years of education: Not on file  . Highest education level: Not on file  Occupational History  . Not on file  Tobacco Use  . Smoking status: Never Smoker  . Smokeless tobacco: Never Used  Substance and Sexual Activity  . Alcohol use: Yes    Alcohol/week: 0.0 standard drinks  . Drug use: No  . Sexual activity: Not Currently  Other Topics Concern  . Not on file  Social History Narrative  . Not on file   Social Determinants of Health   Financial Resource Strain:   . Difficulty of Paying Living Expenses: Not on file  Food Insecurity:   . Worried About Charity fundraiser in the Last Year: Not on file  . Ran Out of Food in the Last  Year: Not on file  Transportation Needs:   . Lack of Transportation (Medical): Not on file  . Lack of Transportation (Non-Medical): Not on file  Physical Activity:   . Days of Exercise per Week: Not on file  . Minutes of Exercise per Session: Not on file  Stress:   . Feeling of Stress : Not on file  Social Connections:   . Frequency of Communication with Friends and Family: Not on file  . Frequency of Social Gatherings with Friends and Family: Not on file  . Attends Religious Services: Not on file  . Active Member of Clubs or Organizations: Not on file  . Attends Archivist Meetings: Not on file  . Marital Status: Not on file  Intimate Partner Violence:   . Fear of Current or Ex-Partner: Not on file  . Emotionally Abused: Not on file  . Physically Abused: Not on file  . Sexually Abused: Not on file    ROS Review of Systems  Constitutional: Negative for activity change, appetite change, chills and fever.  HENT: Negative for congestion, nosebleeds, trouble swallowing and voice change.   Gastrointestinal: Negative for diarrhea, nausea and vomiting.  Genitourinary: Negative for difficulty urinating, dysuria, flank pain and hematuria.  Musculoskeletal: Negative for back pain, joint swelling and neck pain.  Neurological: Negative for dizziness, speech difficulty, light-headedness and numbness.  See HPI. All other review of systems negative.   Objective   No physical was performed  Vitals as reported by the patient: There were no vitals filed for this visit.  Diagnoses and all orders for this visit:  Atypical mole -     Ambulatory referral to Dermatology  Chronic maxillary sinusitis -     Ambulatory referral to ENT  Anxiety - discussed that longer acting meds like SSRIs can help with anxiety  Other orders -     ALPRAZolam (XANAX) 0.25 MG tablet; TAKE 1 TO 1 & 1/2 TABLETS ONCE DAILY AS NEEDED.     I discussed the assessment and treatment plan with the  patient. The patient was provided an opportunity to ask questions and all were answered. The patient agreed with the plan and demonstrated an understanding of the instructions.   The patient was advised to call back or seek an in-person evaluation if the symptoms worsen or if the condition fails to improve as anticipated.  I provided 15 minutes of non-face-to-face time during this encounter.  Forrest Moron, MD  Primary Care at [facility]

## 2019-09-28 NOTE — Progress Notes (Signed)
Xanax refill request.   Referral to Derm and ENT   Sinus pressure constant.

## 2019-09-28 NOTE — Patient Instructions (Signed)
° ° ° °  If you have lab work done today you will be contacted with your lab results within the next 2 weeks.  If you have not heard from us then please contact us. The fastest way to get your results is to register for My Chart. ° ° °IF you received an x-ray today, you will receive an invoice from [facility]. Please contact [facility] at [phone] with questions or concerns regarding your invoice.  ° °IF you received labwork today, you will receive an invoice from [facility]. Please contact [facility] at [phone] with questions or concerns regarding your invoice.  ° °Our billing staff will not be able to assist you with questions regarding bills from these companies. ° °You will be contacted with the lab results as soon as they are available. The fastest way to get your results is to activate your My Chart account. Instructions are located on the last page of this paperwork. If you have not heard from us regarding the results in 2 weeks, please contact this office. °  ° ° ° °

## 2019-10-10 ENCOUNTER — Encounter (INDEPENDENT_AMBULATORY_CARE_PROVIDER_SITE_OTHER): Payer: Self-pay | Admitting: Otolaryngology

## 2019-10-10 ENCOUNTER — Ambulatory Visit (INDEPENDENT_AMBULATORY_CARE_PROVIDER_SITE_OTHER): Payer: BC Managed Care – PPO | Admitting: Otolaryngology

## 2019-10-10 ENCOUNTER — Other Ambulatory Visit: Payer: Self-pay

## 2019-10-10 VITALS — Temp 97.7°F

## 2019-10-10 DIAGNOSIS — H6983 Other specified disorders of Eustachian tube, bilateral: Secondary | ICD-10-CM

## 2019-10-10 DIAGNOSIS — J310 Chronic rhinitis: Secondary | ICD-10-CM

## 2019-10-10 NOTE — Progress Notes (Signed)
HPI: Victoria Ewing is a 44 y.o. female who presents for evaluation of complaints of chronic sinus congestion, sinus pressure and headaches.  She also complains of clogged ears.  She has been using Flonase as well as Zyrtec and Claritin..  She does have history of allergies but has not seen an allergist since moving to [place] several years ago.  She did have a CT scan of her head performed in 2014 that showed minimal sinus disease. Her main complaint is chronic nasal sinus congestion. She is denied blowing out any yellow-green discharge from her nose. She has had no vertigo although she has had some "dizziness".  She has not noted any hearing problems.  Past Medical History:  Diagnosis Date  . Allergy   . Anxiety   . Asthma   . Depression   . Hypertension    Past Surgical History:  Procedure Laterality Date  . CESAREAN SECTION    . lipoma removal     Social History   Socioeconomic History  . Marital status: Single    Spouse name: Not on file  . Number of children: Not on file  . Years of education: Not on file  . Highest education level: Not on file  Occupational History  . Not on file  Tobacco Use  . Smoking status: Never Smoker  . Smokeless tobacco: Never Used  Substance and Sexual Activity  . Alcohol use: Yes    Alcohol/week: 0.0 standard drinks  . Drug use: No  . Sexual activity: Not Currently  Other Topics Concern  . Not on file  Social History Narrative  . Not on file   Social Determinants of Health   Financial Resource Strain:   . Difficulty of Paying Living Expenses: Not on file  Food Insecurity:   . Worried About Charity fundraiser in the Last Year: Not on file  . Ran Out of Food in the Last Year: Not on file  Transportation Needs:   . Lack of Transportation (Medical): Not on file  . Lack of Transportation (Non-Medical): Not on file  Physical Activity:   . Days of Exercise per Week: Not on file  . Minutes of Exercise per Session: Not on file   Stress:   . Feeling of Stress : Not on file  Social Connections:   . Frequency of Communication with Friends and Family: Not on file  . Frequency of Social Gatherings with Friends and Family: Not on file  . Attends Religious Services: Not on file  . Active Member of Clubs or Organizations: Not on file  . Attends Archivist Meetings: Not on file  . Marital Status: Not on file   Family History  Problem Relation Age of Onset  . Cancer Mother        pancreatic  . Stroke Mother 13  . Diabetes Mother   . Kidney disease Mother        transplant in 2010  . Hypertension Mother   . Transient ischemic attack Father 22  . Cancer Father        prostate cancer?  . Stroke Maternal Grandmother 93  . Hypertension Sister 60  . Stroke Cousin 45  . Rheum arthritis Cousin    Allergies  Allergen Reactions  . Other Anaphylaxis    Pecans and nuts   . Effexor Xr [Venlafaxine Hcl Er]    Prior to Admission medications   Medication Sig Start Date End Date Taking? Authorizing Provider  albuterol (PROVENTIL HFA;VENTOLIN HFA) 108 (  90 Base) MCG/ACT inhaler Inhale 2 puffs into the lungs every 6 (six) hours as needed for wheezing. 04/27/18  Yes Jaynee Eagles, PA-C  ALPRAZolam (XANAX) 0.25 MG tablet TAKE 1 TO 1 & 1/2 TABLETS ONCE DAILY AS NEEDED. 09/28/19  Yes Stallings, Zoe A, MD  amLODipine (NORVASC) 5 MG tablet TAKE 1 TABLET BY MOUTH EVERY DAY 09/20/19  Yes Delia Chimes A, MD  cetirizine (ZYRTEC) 10 MG tablet Take 1 tablet (10 mg total) by mouth daily. 04/27/18  Yes Jaynee Eagles, PA-C  fluticasone (FLONASE) 50 MCG/ACT nasal spray Place 2 sprays into both nostrils daily. 12/31/16  Yes Stallings, Zoe A, MD  triamcinolone cream (KENALOG) 0.1 % Apply 1 application topically 2 (two) times daily. 04/27/18  Yes Jaynee Eagles, PA-C     Positive ROS: Otherwise negative  All other systems have been reviewed and were otherwise negative with the exception of those mentioned in the HPI and as  above.  Physical Exam: Constitutional: Alert, well-appearing, no acute distress Ears: External ears without lesions or tenderness. Ear canals are clear bilaterally with intact, clear TMs.  TMs with normal mobility on pneumatic otoscopy.  Hearing screening with a 512 1024 tuning fork revealed good hearing in both ears with AC > BC bilateral. Nasal: External nose without lesions. Septum with minimal deformity.  She had a large inferior turbinates bilaterally with a moderate rhinitis.  After decongesting the nose both the middle meatus regions were clear.  There are no polyps noted.  No mucopurulent discharge noted from the middle meatus.. Oral: Lips and gums without lesions. Tongue and palate mucosa without lesions. Posterior oropharynx clear. Neck: No palpable adenopathy or masses Respiratory: Breathing comfortably  Skin: No facial/neck lesions or rash noted.  Procedures  Assessment: Chronic allergic rhinitis with eustachian tube dysfunction.  Plan: She would probably benefit by seeing an allergist locally. In the meantime recommended regular use of nasal steroid spray and I prescribed Nasacort 2 sprays each nostril at night.  Hopefully this will resolve some of the mucosal edema.  Suggested taking antihistamines on a as needed basis and gave her samples of Allegra and Xyzal to try and compare to the Claritin and Zyrtec which she is already taken. Presently no evidence of active infection requiring further antibiotic therapy. If problems persist down the road surgical intervention would possibly be an option but we would have to repeat the CT scan prior to any surgical intervention.  I had a chance to review her previous CT scan from 6 years ago and this showed relatively clear paranasal sinuses.  Radene Journey, MD

## 2019-11-04 ENCOUNTER — Ambulatory Visit (INDEPENDENT_AMBULATORY_CARE_PROVIDER_SITE_OTHER): Payer: BC Managed Care – PPO | Admitting: Family Medicine

## 2019-11-04 ENCOUNTER — Other Ambulatory Visit: Payer: Self-pay

## 2019-11-04 ENCOUNTER — Encounter: Payer: Self-pay | Admitting: Family Medicine

## 2019-11-04 VITALS — BP 165/87 | HR 83 | Temp 98.6°F | Ht 70.0 in | Wt 312.8 lb

## 2019-11-04 DIAGNOSIS — Z6841 Body Mass Index (BMI) 40.0 and over, adult: Secondary | ICD-10-CM | POA: Diagnosis not present

## 2019-11-04 DIAGNOSIS — I10 Essential (primary) hypertension: Secondary | ICD-10-CM

## 2019-11-04 DIAGNOSIS — R635 Abnormal weight gain: Secondary | ICD-10-CM

## 2019-11-04 DIAGNOSIS — Z0000 Encounter for general adult medical examination without abnormal findings: Secondary | ICD-10-CM

## 2019-11-04 DIAGNOSIS — Z131 Encounter for screening for diabetes mellitus: Secondary | ICD-10-CM

## 2019-11-04 DIAGNOSIS — Z0001 Encounter for general adult medical examination with abnormal findings: Secondary | ICD-10-CM

## 2019-11-04 DIAGNOSIS — G479 Sleep disorder, unspecified: Secondary | ICD-10-CM | POA: Diagnosis not present

## 2019-11-04 NOTE — Progress Notes (Signed)
Chief Complaint  Patient presents with  . Annual Exam    cpe (amlodine med dife effects)    Subjective:  Victoria Ewing is a 44 y.o. female here for a health maintenance visit.  Patient is established pt  Pt reports that she took the amlodipine and did not feel well. She did not check her blood pressure to see if her readings were too low.   Patient reports that she is working from home most days She states that she is not exercising consistently She states that she joined a walking challenge that started the 19th  Wt Readings from Last 3 Encounters:  11/04/19 (!) 312 lb 12.8 oz (141.9 kg)  09/15/18 (!) 306 lb 12.8 oz (139.2 kg)  08/20/18 300 lb (136.1 kg)   BP Readings from Last 3 Encounters:  11/04/19 (!) 165/87  09/15/18 132/89  08/20/18 128/75     Patient Active Problem List   Diagnosis Date Noted  . Acute pharyngitis 10/31/2017  . Sore throat 10/31/2017  . Exposure to strep throat 10/31/2017  . Exposure to influenza 10/31/2017  . Cough 08/05/2017  . Fever 08/05/2017  . Lower respiratory infection 08/05/2017  . BMI 40.0-44.9, adult ([place]) 03/05/2017  . Eczema 12/14/2014  . Asthma, chronic 08/30/2014  . Anxiety state 07/22/2013  . Allergic rhinitis 07/22/2013    Past Medical History:  Diagnosis Date  . Allergy   . Anxiety   . Asthma   . Depression   . Hypertension     Past Surgical History:  Procedure Laterality Date  . CESAREAN SECTION    . lipoma removal       Outpatient Medications Prior to Visit  Medication Sig Dispense Refill  . albuterol (PROVENTIL HFA;VENTOLIN HFA) 108 (90 Base) MCG/ACT inhaler Inhale 2 puffs into the lungs every 6 (six) hours as needed for wheezing. 1 Inhaler 1  . ALPRAZolam (XANAX) 0.25 MG tablet TAKE 1 TO 1 & 1/2 TABLETS ONCE DAILY AS NEEDED. 45 tablet 0  . cephALEXin (KEFLEX) 500 MG capsule Take 500 mg by mouth 2 (two) times daily.    . cetirizine (ZYRTEC) 10 MG tablet Take 1 tablet (10 mg total) by mouth daily. 90  tablet 3  . Triamcinolone Acetonide (NASACORT ALLERGY 24HR NA) Place into the nose.    . triamcinolone cream (KENALOG) 0.1 % Apply 1 application topically 2 (two) times daily. 30 g 5  . amLODipine (NORVASC) 5 MG tablet TAKE 1 TABLET BY MOUTH EVERY DAY (Patient not taking: Reported on 11/04/2019) 30 tablet 3  . fluticasone (FLONASE) 50 MCG/ACT nasal spray Place 2 sprays into both nostrils daily. (Patient not taking: Reported on 11/04/2019) 16 g 6   No facility-administered medications prior to visit.    Allergies  Allergen Reactions  . Other Anaphylaxis    Pecans and nuts   . Effexor Xr [Venlafaxine Hcl Er]      Family History  Problem Relation Age of Onset  . Cancer Mother        pancreatic  . Stroke Mother 71  . Diabetes Mother   . Kidney disease Mother        transplant in 2010  . Hypertension Mother   . Transient ischemic attack Father 9  . Cancer Father        prostate cancer?  . Stroke Maternal Grandmother 55  . Hypertension Sister 26  . Stroke Cousin 45  . Rheum arthritis Cousin      Health Habits: Dental Exam: up to date Eye  Exam: up to date Exercise: 2 times/week on average Current exercise activities: walking/running Diet: low sodium  Social History   Socioeconomic History  . Marital status: Single    Spouse name: Not on file  . Number of children: Not on file  . Years of education: Not on file  . Highest education level: Not on file  Occupational History  . Not on file  Tobacco Use  . Smoking status: Never Smoker  . Smokeless tobacco: Never Used  Substance and Sexual Activity  . Alcohol use: Yes    Alcohol/week: 0.0 standard drinks  . Drug use: No  . Sexual activity: Not Currently  Other Topics Concern  . Not on file  Social History Narrative  . Not on file   Social Determinants of Health   Financial Resource Strain:   . Difficulty of Paying Living Expenses: Not on file  Food Insecurity:   . Worried About Charity fundraiser in the Last  Year: Not on file  . Ran Out of Food in the Last Year: Not on file  Transportation Needs:   . Lack of Transportation (Medical): Not on file  . Lack of Transportation (Non-Medical): Not on file  Physical Activity:   . Days of Exercise per Week: Not on file  . Minutes of Exercise per Session: Not on file  Stress:   . Feeling of Stress : Not on file  Social Connections:   . Frequency of Communication with Friends and Family: Not on file  . Frequency of Social Gatherings with Friends and Family: Not on file  . Attends Religious Services: Not on file  . Active Member of Clubs or Organizations: Not on file  . Attends Archivist Meetings: Not on file  . Marital Status: Not on file  Intimate Partner Violence:   . Fear of Current or Ex-Partner: Not on file  . Emotionally Abused: Not on file  . Physically Abused: Not on file  . Sexually Abused: Not on file   Social History   Substance and Sexual Activity  Alcohol Use Yes  . Alcohol/week: 0.0 standard drinks   Social History   Tobacco Use  Smoking Status Never Smoker  Smokeless Tobacco Never Used   Social History   Substance and Sexual Activity  Drug Use No    GYN: Sexual Health Menstrual status: regular menses LMP: Patient's last menstrual period was 10/21/2019. Last pap smear: see HM section History of abnormal pap smears: no.  Sees Dr. Mancel Bale for Gyne Sexually active: with female partner but not currently Current contraception: none  Health Maintenance: See under health Maintenance activity for review of completion dates as well. Immunization History  Administered Date(s) Administered  . Influenza,inj,Quad PF,6+ Mos 09/15/2018  . PPD Test 05/30/2013  . Tdap 10/17/2013      Depression Screen-PHQ2/9 Depression screen [facility] 2/9 11/04/2019 09/28/2019 09/15/2018 04/27/2018 10/31/2017  Decreased Interest 0 0 0 0 0  Down, Depressed, Hopeless 0 0 0 0 0  PHQ - 2 Score 0 0 0 0 0  Altered sleeping - - - - -  Tired,  decreased energy - - - - -  Change in appetite - - - - -  Feeling bad or failure about yourself  - - - - -  Trouble concentrating - - - - -  Moving slowly or fidgety/restless - - - - -  Suicidal thoughts - - - - -  PHQ-9 Score - - - - -  Depression Severity and Treatment Recommendations:  0-4= None  5-9= Mild / Treatment: Support, educate to call if worse; return in one month  10-14= Moderate / Treatment: Support, watchful waiting; Antidepressant or Psycotherapy  15-19= Moderately severe / Treatment: Antidepressant OR Psychotherapy  >= 20 = Major depression, severe / Antidepressant AND Psychotherapy    Review of Systems   Review of Systems  Constitutional: Negative for chills and fever.  Respiratory: Negative for cough, sputum production and shortness of breath.   Cardiovascular: Negative for chest pain and palpitations.  Gastrointestinal: Negative for nausea and vomiting.  Genitourinary: Negative for dysuria and urgency.  Neurological: Negative for dizziness and headaches.  Psychiatric/Behavioral: Negative for depression and suicidal ideas. The patient is nervous/anxious.     See HPI for ROS as well.   Review of Systems  Constitutional: Negative for activity change, appetite change, chills and fever.  HENT: Negative for congestion, nosebleeds, trouble swallowing and voice change.   Respiratory: Negative for cough, shortness of breath and wheezing.   Gastrointestinal: Negative for diarrhea, nausea and vomiting.  Genitourinary: Negative for difficulty urinating, dysuria, flank pain and hematuria.  Musculoskeletal: Negative for back pain, joint swelling and neck pain.  Neurological: Negative for dizziness, speech difficulty, light-headedness and numbness.  See HPI. All other review of systems negative.   Objective:   Vitals:   11/04/19 1013  BP: (!) 165/87  Pulse: 83  Temp: 98.6 F (37 C)  TempSrc: Temporal  SpO2: 95%  Weight: (!) 312 lb 12.8 oz (141.9 kg)    Height: '5\' 10"'  (1.778 m)   Weight  Wt Readings from Last 3 Encounters:  11/04/19 (!) 312 lb 12.8 oz (141.9 kg)  09/15/18 (!) 306 lb 12.8 oz (139.2 kg)  08/20/18 300 lb (136.1 kg)    Body mass index is 44.88 kg/m.  Physical Exam Constitutional:      Appearance: Normal appearance. She is normal weight.  HENT:     Head: Normocephalic and atraumatic.     Right Ear: Tympanic membrane normal.     Left Ear: Tympanic membrane normal.     Nose: Nose normal. No congestion.  Eyes:     Extraocular Movements: Extraocular movements intact.     Conjunctiva/sclera: Conjunctivae normal.  Cardiovascular:     Rate and Rhythm: Normal rate and regular rhythm.  Pulmonary:     Effort: Pulmonary effort is normal. No respiratory distress.     Breath sounds: Normal breath sounds. No stridor. No wheezing, rhonchi or rales.  Chest:     Chest wall: No tenderness.  Abdominal:     General: Abdomen is flat. Bowel sounds are normal. There is no distension.     Palpations: Abdomen is soft. There is no mass.     Tenderness: There is no abdominal tenderness. There is no right CVA tenderness, left CVA tenderness, guarding or rebound.     Hernia: No hernia is present.  Musculoskeletal:        General: No swelling or tenderness. Normal range of motion.     Cervical back: Normal range of motion and neck supple.  Skin:    General: Skin is warm.     Coloration: Skin is not jaundiced or pale.  Neurological:     General: No focal deficit present.     Mental Status: She is alert and oriented to person, place, and time.  Psychiatric:        Mood and Affect: Mood normal.        Behavior: Behavior normal.  Thought Content: Thought content normal.        Judgment: Judgment normal.        Assessment/Plan:   Patient was seen for a health maintenance exam.  Counseled the patient on health maintenance issues. Reviewed her health mainteance schedule and ordered appropriate tests (see orders.) Counseled on  regular exercise and weight management. Recommend regular eye exams and dental cleaning.   The following issues were addressed today for health maintenance:   Cinthia was seen today for annual exam.  Diagnoses and all orders for this visit:  Encounter for health maintenance examination in adult- Women's Health Maintenance Plan Advised monthly breast exam and annual mammogram Advised dental exam every six months Discussed stress management Discussed pap smear screening guidelines   Weight gain - discussed weight loss -     TSH -     Hemoglobin A1c  BMI 40.0-44.9, adult ([place])-  Discussed diet and exercise  Difficulty sleeping- will review labs  Essential hypertension-  Advised to resume amlodipine  -     CMP14+EGFR -     Lipid panel -     Microalbumin, urine  Screening for diabetes mellitus -     Hemoglobin A1c    Return in about 4 weeks (around 12/02/2019) for lab follow up .    Body mass index is 44.88 kg/m.:  Discussed the patient's BMI with patient. The BMI body mass index is 44.88 kg/m.     Future Appointments  Date Time Provider [place]  12/02/2019  9:20 AM Forrest Moron, MD PCP-PCP [facility]    Patient Instructions     COVID-19 Vaccine Information can be found at: [email or web address] For questions related to vaccine distribution or appointments, please email [email or web address] or call [phone].     If you have lab work done today you will be contacted with your lab results within the next 2 weeks.  If you have not heard from [place] then please contact us. The fastest way to get your results is to register for My Chart.   IF you received an x-ray today, you will receive an invoice from [facility]. Please contact [facility] at [phone] with questions or concerns regarding your invoice.   IF you received labwork today, you will receive an invoice from [place].  Please contact [facility] at [phone] with questions or concerns regarding your invoice.   Our billing staff will not be able to assist you with questions regarding bills from these companies.  You will be contacted with the lab results as soon as they are available. The fastest way to get your results is to activate your My Chart account. Instructions are located on the last page of this paperwork. If you have not heard from [place] regarding the results in 2 weeks, please contact this office.      How to Take Your Blood Pressure Blood pressure is a measurement of how strongly your blood is pressing against the walls of your arteries. Arteries are blood vessels that carry blood from your heart throughout your body. Your health care provider takes your blood pressure at each office visit. You can also take your own blood pressure at home with a blood pressure machine. You may need to take your own blood pressure:  To confirm a diagnosis of high blood pressure (hypertension).  To monitor your blood pressure over time.  To make sure your blood pressure medicine is working. Supplies needed: To take your blood pressure, you will need a blood pressure machine. You  can buy a blood pressure machine, or blood pressure monitor, at most drugstores or online. There are several types of home blood pressure monitors. When choosing one, consider the following:  Choose a monitor that has an arm cuff.  Choose a cuff that wraps snugly around your upper arm. You should be able to fit only one finger between your arm and the cuff.  Do not choose a monitor that measures your blood pressure from your wrist or finger. Your health care provider can suggest a reliable monitor that will meet your needs. How to prepare To get the most accurate reading, avoid the following for 30 minutes before you check your blood pressure:  Drinking caffeine.  Drinking alcohol.  Eating.  Smoking.  Exercising. Five minutes  before you check your blood pressure:  Empty your bladder.  Sit quietly without talking in a dining chair, rather than in a soft couch or armchair. How to take your blood pressure To check your blood pressure, follow the instructions in the manual that came with your blood pressure monitor. If you have a digital blood pressure monitor, the instructions may be as follows: 1. Sit up straight. 2. Place your feet on the floor. Do not cross your ankles or legs. 3. Rest your left arm at the level of your heart on a table or desk or on the arm of a chair. 4. Pull up your shirt sleeve. 5. Wrap the blood pressure cuff around the upper part of your left arm, 1 inch (2.5 cm) above your elbow. It is best to wrap the cuff around bare skin. 6. Fit the cuff snugly around your arm. You should be able to place only one finger between the cuff and your arm. 7. Position the cord inside the groove of your elbow. 8. Press the power button. 9. Sit quietly while the cuff inflates and deflates. 10. Read the digital reading on the monitor screen and write it down (record it). 11. Wait 2-3 minutes, then repeat the steps, starting at step 1. What does my blood pressure reading mean? A blood pressure reading consists of a higher number over a lower number. Ideally, your blood pressure should be below 120/80. The first ("top") number is called the systolic pressure. It is a measure of the pressure in your arteries as your heart beats. The second ("bottom") number is called the diastolic pressure. It is a measure of the pressure in your arteries as the heart relaxes. Blood pressure is classified into four stages. The following are the stages for adults who do not have a short-term serious illness or a chronic condition. Systolic pressure and diastolic pressure are measured in a unit called mm Hg. Normal  Systolic pressure: below 300.  Diastolic pressure: below 80. Elevated  Systolic pressure: 762-263.  Diastolic  pressure: below 80. Hypertension stage 1  Systolic pressure: 335-456.  Diastolic pressure: 25-63. Hypertension stage 2  Systolic pressure: 893 or above.  Diastolic pressure: 90 or above. You can have prehypertension or hypertension even if only the systolic or only the diastolic number in your reading is higher than normal. Follow these instructions at home:  Check your blood pressure as often as recommended by your health care provider.  Take your monitor to the next appointment with your health care provider to make sure: ? That you are using it correctly. ? That it provides accurate readings.  Be sure you understand what your goal blood pressure numbers are.  Tell your health care provider if you  are having any side effects from blood pressure medicine. Contact a health care provider if:  Your blood pressure is consistently high. Get help right away if:  Your systolic blood pressure is higher than 180.  Your diastolic blood pressure is higher than 110. This information is not intended to replace advice given to you by your health care provider. Make sure you discuss any questions you have with your health care provider. Document Revised: 09/04/2017 Document Reviewed: 02/29/2016 Elsevier Patient Education  2020 [employer].

## 2019-11-04 NOTE — Patient Instructions (Addendum)
COVID-19 Vaccine Information can be found at: [email or web address] For questions related to vaccine distribution or appointments, please email [email or web address] or call [phone].     If you have lab work done today you will be contacted with your lab results within the next 2 weeks.  If you have not heard from [place] then please contact us. The fastest way to get your results is to register for My Chart.   IF you received an x-ray today, you will receive an invoice from [facility]. Please contact [facility] at [phone] with questions or concerns regarding your invoice.   IF you received labwork today, you will receive an invoice from [place]. Please contact [facility] at [phone] with questions or concerns regarding your invoice.   Our billing staff will not be able to assist you with questions regarding bills from these companies.  You will be contacted with the lab results as soon as they are available. The fastest way to get your results is to activate your My Chart account. Instructions are located on the last page of this paperwork. If you have not heard from [place] regarding the results in 2 weeks, please contact this office.      How to Take Your Blood Pressure Blood pressure is a measurement of how strongly your blood is pressing against the walls of your arteries. Arteries are blood vessels that carry blood from your heart throughout your body. Your health care provider takes your blood pressure at each office visit. You can also take your own blood pressure at home with a blood pressure machine. You may need to take your own blood pressure:  To confirm a diagnosis of high blood pressure (hypertension).  To monitor your blood pressure over time.  To make sure your blood pressure medicine is working. Supplies needed: To take your blood pressure, you will need a blood pressure machine. You can  buy a blood pressure machine, or blood pressure monitor, at most drugstores or online. There are several types of home blood pressure monitors. When choosing one, consider the following:  Choose a monitor that has an arm cuff.  Choose a cuff that wraps snugly around your upper arm. You should be able to fit only one finger between your arm and the cuff.  Do not choose a monitor that measures your blood pressure from your wrist or finger. Your health care provider can suggest a reliable monitor that will meet your needs. How to prepare To get the most accurate reading, avoid the following for 30 minutes before you check your blood pressure:  Drinking caffeine.  Drinking alcohol.  Eating.  Smoking.  Exercising. Five minutes before you check your blood pressure:  Empty your bladder.  Sit quietly without talking in a dining chair, rather than in a soft couch or armchair. How to take your blood pressure To check your blood pressure, follow the instructions in the manual that came with your blood pressure monitor. If you have a digital blood pressure monitor, the instructions may be as follows: 1. Sit up straight. 2. Place your feet on the floor. Do not cross your ankles or legs. 3. Rest your left arm at the level of your heart on a table or desk or on the arm of a chair. 4. Pull up your shirt sleeve. 5. Wrap the blood pressure cuff around the upper part of your left arm, 1 inch (2.5 cm) above your elbow. It is best to wrap the cuff around bare skin. 6. Fit the  cuff snugly around your arm. You should be able to place only one finger between the cuff and your arm. 7. Position the cord inside the groove of your elbow. 8. Press the power button. 9. Sit quietly while the cuff inflates and deflates. 10. Read the digital reading on the monitor screen and write it down (record it). 11. Wait 2-3 minutes, then repeat the steps, starting at step 1. What does my blood pressure reading mean? A  blood pressure reading consists of a higher number over a lower number. Ideally, your blood pressure should be below 120/80. The first ("top") number is called the systolic pressure. It is a measure of the pressure in your arteries as your heart beats. The second ("bottom") number is called the diastolic pressure. It is a measure of the pressure in your arteries as the heart relaxes. Blood pressure is classified into four stages. The following are the stages for adults who do not have a short-term serious illness or a chronic condition. Systolic pressure and diastolic pressure are measured in a unit called mm Hg. Normal  Systolic pressure: below 123456.  Diastolic pressure: below 80. Elevated  Systolic pressure: [id number].  Diastolic pressure: below 80. Hypertension stage 1  Systolic pressure: 0000000.  Diastolic pressure: [id number]. Hypertension stage 2  Systolic pressure: [id number] or above.  Diastolic pressure: 90 or above. You can have prehypertension or hypertension even if only the systolic or only the diastolic number in your reading is higher than normal. Follow these instructions at home:  Check your blood pressure as often as recommended by your health care provider.  Take your monitor to the next appointment with your health care provider to make sure: ? That you are using it correctly. ? That it provides accurate readings.  Be sure you understand what your goal blood pressure numbers are.  Tell your health care provider if you are having any side effects from blood pressure medicine. Contact a health care provider if:  Your blood pressure is consistently high. Get help right away if:  Your systolic blood pressure is higher than 180.  Your diastolic blood pressure is higher than 110. This information is not intended to replace advice given to you by your health care provider. Make sure you discuss any questions you have with your health care provider. Document Revised:  09/04/2017 Document Reviewed: 02/29/2016 Elsevier Patient Education  2020 [employer].

## 2019-11-05 LAB — LIPID PANEL
Chol/HDL Ratio: 2.8 ratio (ref 0.0–4.4)
Cholesterol, Total: 142 mg/dL (ref 100–199)
HDL: 50 mg/dL (ref >39)
LDL Chol Calc (NIH): 75 mg/dL (ref 0–99)
Triglycerides: 93 mg/dL (ref 0–149)
VLDL Cholesterol Cal: 17 mg/dL (ref 5–40)

## 2019-11-05 LAB — CMP14+EGFR
ALT: 19 IU/L (ref 0–32)
AST: 20 IU/L (ref 0–40)
Albumin/Globulin Ratio: 1.6 (ref 1.2–2.2)
Albumin: 4.3 g/dL (ref 3.8–4.8)
Alkaline Phosphatase: 56 IU/L (ref 39–117)
BUN/Creatinine Ratio: 15 (ref 9–23)
BUN: 13 mg/dL (ref 6–24)
Bilirubin Total: 0.3 mg/dL (ref 0.0–1.2)
CO2: 22 mmol/L (ref 20–29)
Calcium: 9.2 mg/dL (ref 8.7–10.2)
Chloride: 105 mmol/L (ref 96–106)
Creatinine, Ser: 0.86 mg/dL (ref 0.57–1.00)
GFR calc Af Amer: 97 mL/min/{1.73_m2} (ref >59)
GFR calc non Af Amer: 84 mL/min/{1.73_m2} (ref >59)
Globulin, Total: 2.7 g/dL (ref 1.5–4.5)
Glucose: 102 mg/dL — ABNORMAL HIGH (ref 65–99)
Potassium: 4.4 mmol/L (ref 3.5–5.2)
Sodium: 141 mmol/L (ref 134–144)
Total Protein: 7 g/dL (ref 6.0–8.5)

## 2019-11-05 LAB — MICROALBUMIN, URINE: Microalbumin, Urine: <3 ug/mL

## 2019-11-05 LAB — HEMOGLOBIN A1C
Est. average glucose Bld gHb Est-mCnc: 117 mg/dL
Hgb A1c MFr Bld: 5.7 % — ABNORMAL HIGH (ref 4.8–5.6)

## 2019-11-05 LAB — TSH: TSH: 1.11 u[IU]/mL (ref 0.450–4.500)

## 2019-12-02 ENCOUNTER — Encounter: Payer: Self-pay | Admitting: Family Medicine

## 2019-12-02 ENCOUNTER — Ambulatory Visit: Payer: BC Managed Care – PPO | Admitting: Family Medicine

## 2019-12-02 ENCOUNTER — Other Ambulatory Visit: Payer: Self-pay

## 2019-12-02 VITALS — BP 140/86 | HR 77 | Temp 98.1°F | Ht 69.5 in | Wt 312.0 lb

## 2019-12-02 DIAGNOSIS — F419 Anxiety disorder, unspecified: Secondary | ICD-10-CM

## 2019-12-02 DIAGNOSIS — G479 Sleep disorder, unspecified: Secondary | ICD-10-CM

## 2019-12-02 DIAGNOSIS — Z23 Encounter for immunization: Secondary | ICD-10-CM | POA: Diagnosis not present

## 2019-12-02 DIAGNOSIS — R7303 Prediabetes: Secondary | ICD-10-CM

## 2019-12-02 MED ORDER — AMLODIPINE BESYLATE 5 MG PO TABS: 2.5000 mg | ORAL_TABLET | Freq: Two times a day (BID) | ORAL | 1 refills | Status: DC

## 2019-12-02 MED ORDER — ALPRAZOLAM 0.25 MG PO TABS: ORAL_TABLET | ORAL | 0 refills | Status: DC

## 2019-12-02 NOTE — Patient Instructions (Addendum)
If you have lab work done today you will be contacted with your lab results within the next 2 weeks.  If you have not heard from [place] then please contact us. The fastest way to get your results is to register for My Chart.   IF you received an x-ray today, you will receive an invoice from [facility]. Please contact [facility] at [phone] with questions or concerns regarding your invoice.   IF you received labwork today, you will receive an invoice from [facility]. Please contact [facility] at [phone] with questions or concerns regarding your invoice.   Our billing staff will not be able to assist you with questions regarding bills from these companies.  You will be contacted with the lab results as soon as they are available. The fastest way to get your results is to activate your My Chart account. Instructions are located on the last page of this paperwork. If you have not heard from [place] regarding the results in 2 weeks, please contact this office.      Intertrigo Intertrigo is skin irritation or inflammation (dermatitis) that occurs when folds of skin rub together. The irritation can cause a rash and make skin raw and itchy. This condition most commonly occurs in the skin folds of these areas:  Toes.  Armpits.  Groin.  Under the belly.  Under the breasts.  Buttocks. Intertrigo is not passed from person to person (is not contagious). What are the causes? This condition is caused by heat, moisture, rubbing (friction), and not enough air circulation. The condition can be made worse by:  Sweat.  Bacteria.  A fungus, such as yeast. What increases the risk? This condition is more likely to occur if you have moisture in your skin folds. You are more likely to develop this condition if you:  Have diabetes.  Are overweight.  Are not able to move around or are not active.  Live in a warm and moist climate.  Wear splints, braces, or other medical  devices.  Are not able to control your bowels or bladder (have incontinence). What are the signs or symptoms? Symptoms of this condition include:  A pink or red skin rash in the skin fold or near the skin fold.  Raw or scaly skin.  Itchiness.  A burning feeling.  Bleeding.  Leaking fluid.  A bad smell. How is this diagnosed? This condition is diagnosed with a medical history and physical exam. You may also have a skin swab to test for bacteria or a fungus. How is this treated? This condition may be treated by:  Cleaning and drying your skin.  Taking an antibiotic medicine or using an antibiotic skin cream for a bacterial infection.  Using an antifungal cream on your skin or taking pills for an infection that was caused by a fungus, such as yeast.  Using a steroid ointment to relieve itchiness and irritation.  Separating the skin fold with a clean cotton cloth to absorb moisture and allow air to flow into the area. Follow these instructions at home:  Keep the affected area clean and dry.  Do not scratch your skin.  Stay in a cool environment as much as possible. Use an air conditioner or fan, if available.  Apply over-the-counter and prescription medicines only as told by your health care provider.  If you were prescribed an antibiotic medicine, use it as told by your health care provider. Do not stop using the antibiotic even if your condition improves.  Keep all follow-up visits as told by your health care provider. This is important. How is this prevented?   Maintain a healthy weight.  Take care of your feet, especially if you have diabetes. Foot care includes: ? Wearing shoes that fit well. ? Keeping your feet dry. ? Wearing clean, breathable socks.  Protect the skin around your groin and buttocks, especially if you have incontinence. Skin protection includes: ? Following a regular cleaning routine. ? Using skin protectant creams, powders, or  ointments. ? Changing protection pads frequently.  Do not wear tight clothes. Wear clothes that are loose, absorbent, and made of cotton.  Wear a bra that gives good support, if needed.  Shower and dry yourself well after activity or exercise. Use a hair dryer on a cool setting to dry between skin folds, especially after you bathe.  If you have diabetes, keep your blood sugar under control. Contact a health care provider if:  Your symptoms do not improve with treatment.  Your symptoms get worse or they spread.  You notice increased redness and warmth.  You have a fever. Summary  Intertrigo is skin irritation or inflammation (dermatitis) that occurs when folds of skin rub together.  This condition is caused by heat, moisture, rubbing (friction), and not enough air circulation.  This condition may be treated by cleaning and drying your skin and with medicines.  Apply over-the-counter and prescription medicines only as told by your health care provider.  Keep all follow-up visits as told by your health care provider. This is important. This information is not intended to replace advice given to you by your health care provider. Make sure you discuss any questions you have with your health care provider. Document Revised: 02/22/2018 Document Reviewed: 02/22/2018 Elsevier Patient Education  [place].

## 2019-12-02 NOTE — Progress Notes (Signed)
Established Patient Office Visit  Subjective:  Patient ID: Victoria Ewing, female    DOB: [birth year]  Age: 44 y.o. MRN: [id number]  CC:  Chief Complaint  Patient presents with  . Nutrition Counseling    1 m f/u   . Anxiety    med refills    HPI CHANTEA LANGBEHN presents for   Hypertension: Patient here for follow-up of elevated blood pressure. She is exercising and is adherent to low salt diet.  Blood pressure is well controlled at home. Cardiac symptoms none. Patient denies chest pain, claudication, dyspnea, fatigue, irregular heart beat and lower extremity edema.  Cardiovascular risk factors: hypertension and obesity (BMI >= 30 kg/m2). Use of agents associated with hypertension: none. History of target organ damage: none.  BP Readings from Last 3 Encounters:  12/02/19 140/86  11/04/19 (!) 165/87  09/15/18 132/89   She reports that she is taking a 1/2 pill of amlodipine  She states that she is back on the medication  Her bps can run in the 150s and 160s but mostly high 0000000 systolic She is taking her 1/2 pill of amlodipine in the morning    Sleep Disturbance/Mood disorder She reports that she is exhausted when she gets home She does not feel sad or depressed Her mood is better She notices that it is worse around her cycle   Need for vaccines She has questions about the COVID vaccine and HPV vaccine.    Past Medical History:  Diagnosis Date  . Allergy   . Anxiety   . Asthma   . Depression   . Hypertension     Past Surgical History:  Procedure Laterality Date  . CESAREAN SECTION    . lipoma removal      Family History  Problem Relation Age of Onset  . Cancer Mother        pancreatic  . Stroke Mother 8  . Diabetes Mother   . Kidney disease Mother        transplant in 2010  . Hypertension Mother   . Transient ischemic attack Father 93  . Cancer Father        prostate cancer?  . Stroke Maternal Grandmother 71  . Hypertension Sister 1  .  Stroke Cousin 45  . Rheum arthritis Cousin     Social History   Socioeconomic History  . Marital status: Single    Spouse name: Not on file  . Number of children: Not on file  . Years of education: Not on file  . Highest education level: Not on file  Occupational History  . Not on file  Tobacco Use  . Smoking status: Never Smoker  . Smokeless tobacco: Never Used  Substance and Sexual Activity  . Alcohol use: Yes    Alcohol/week: 0.0 standard drinks  . Drug use: No  . Sexual activity: Not Currently  Other Topics Concern  . Not on file  Social History Narrative  . Not on file   Social Determinants of Health   Financial Resource Strain:   . Difficulty of Paying Living Expenses: Not on file  Food Insecurity:   . Worried About Charity fundraiser in the Last Year: Not on file  . Ran Out of Food in the Last Year: Not on file  Transportation Needs:   . Lack of Transportation (Medical): Not on file  . Lack of Transportation (Non-Medical): Not on file  Physical Activity:   . Days of Exercise per Week: Not on  file  . Minutes of Exercise per Session: Not on file  Stress:   . Feeling of Stress : Not on file  Social Connections:   . Frequency of Communication with Friends and Family: Not on file  . Frequency of Social Gatherings with Friends and Family: Not on file  . Attends Religious Services: Not on file  . Active Member of Clubs or Organizations: Not on file  . Attends Archivist Meetings: Not on file  . Marital Status: Not on file  Intimate Partner Violence:   . Fear of Current or Ex-Partner: Not on file  . Emotionally Abused: Not on file  . Physically Abused: Not on file  . Sexually Abused: Not on file    Outpatient Medications Prior to Visit  Medication Sig Dispense Refill  . albuterol (PROVENTIL HFA;VENTOLIN HFA) 108 (90 Base) MCG/ACT inhaler Inhale 2 puffs into the lungs every 6 (six) hours as needed for wheezing. 1 Inhaler 1  . ALPRAZolam (XANAX) 0.25  MG tablet TAKE 1 TO 1 & 1/2 TABLETS ONCE DAILY AS NEEDED. 45 tablet 0  . amLODipine (NORVASC) 5 MG tablet TAKE 1 TABLET BY MOUTH EVERY DAY 30 tablet 3  . Levocetirizine Dihydrochloride (XYZAL ALLERGY 24HR PO)     . triamcinolone (NASACORT) 55 MCG/ACT AERO nasal inhaler 1 spray at bedtime.    . Triamcinolone Acetonide (NASACORT ALLERGY 24HR NA) Place into the nose.    . triamcinolone cream (KENALOG) 0.1 % Apply 1 application topically 2 (two) times daily. 30 g 5  . cephALEXin (KEFLEX) 500 MG capsule Take 500 mg by mouth 2 (two) times daily.    . cetirizine (ZYRTEC) 10 MG tablet Take 1 tablet (10 mg total) by mouth daily. 90 tablet 3   No facility-administered medications prior to visit.    Allergies  Allergen Reactions  . Other Anaphylaxis    Pecans and nuts   . Effexor Xr [Venlafaxine Hcl Er]     ROS Review of Systems Review of Systems  Constitutional: Negative for activity change, appetite change, chills and fever.  HENT: Negative for congestion, nosebleeds, trouble swallowing and voice change.   Respiratory: Negative for cough, shortness of breath and wheezing.   Gastrointestinal: Negative for diarrhea, nausea and vomiting.  Genitourinary: Negative for difficulty urinating, dysuria, flank pain and hematuria.  Musculoskeletal: Negative for back pain, joint swelling and neck pain.  Neurological: Negative for dizziness, speech difficulty, light-headedness and numbness.  See HPI. All other review of systems negative.     Objective:    Physical Exam  BP 140/86   Pulse 77   Temp 98.1 F (36.7 C) (Temporal)   Ht 5' 9.5" (1.765 m)   Wt (!) 312 lb (141.5 kg)   LMP 11/21/2019   SpO2 99%   BMI 45.41 kg/m  Wt Readings from Last 3 Encounters:  12/02/19 (!) 312 lb (141.5 kg)  11/04/19 (!) 312 lb 12.8 oz (141.9 kg)  09/15/18 (!) 306 lb 12.8 oz (139.2 kg)    Physical Exam  Constitutional: Oriented to person, place, and time. Appears well-developed and well-nourished.  HENT:    Head: Normocephalic and atraumatic.  Eyes: Conjunctivae and EOM are normal.  Cardiovascular: Normal rate, regular rhythm, normal heart sounds and intact distal pulses.  No murmur heard. Pulmonary/Chest: Effort normal and breath sounds normal. No stridor. No respiratory distress. Has no wheezes.  Neurological: Is alert and oriented to person, place, and time.  Skin: Skin is warm. Capillary refill takes less than 2 seconds.  Psychiatric: Has a normal mood and affect. Behavior is normal. Judgment and thought content normal.    Health Maintenance Due  Topic Date Due  . PAP SMEAR-Modifier  01/01/2020    There are no preventive care reminders to display for this patient.  Lab Results  Component Value Date   TSH 1.110 11/04/2019   Lab Results  Component Value Date   WBC 5.8 08/20/2018   HGB 12.6 08/20/2018   HCT 41.9 08/20/2018   MCV 89.1 08/20/2018   PLT 338 08/20/2018   Lab Results  Component Value Date   NA 141 11/04/2019   K 4.4 11/04/2019   CO2 22 11/04/2019   GLUCOSE 102 (H) 11/04/2019   BUN 13 11/04/2019   CREATININE 0.86 11/04/2019   BILITOT 0.3 11/04/2019   ALKPHOS 56 11/04/2019   AST 20 11/04/2019   ALT 19 11/04/2019   PROT 7.0 11/04/2019   ALBUMIN 4.3 11/04/2019   CALCIUM 9.2 11/04/2019   ANIONGAP 7 08/20/2018   Lab Results  Component Value Date   CHOL 142 11/04/2019   Lab Results  Component Value Date   HDL 50 11/04/2019   Lab Results  Component Value Date   LDLCALC 75 11/04/2019   Lab Results  Component Value Date   TRIG 93 11/04/2019   Lab Results  Component Value Date   CHOLHDL 2.8 11/04/2019   Lab Results  Component Value Date   HGBA1C 5.7 (H) 11/04/2019      Assessment & Plan:   Problem List Items Addressed This Visit    None    Visit Diagnoses    Prediabetes    -  Primary Discussed her labs Since she is prediabetic with family history of diabetes will refer to Nutrition   Relevant Orders   Ambulatory referral to diabetic  education   Difficulty sleeping       Anxiety    -  Discussed her mood and the impact on sleep     Need for vaccination -  Advised covid-19 vaccine, advised pt to get HPV vaccine which is approved up to age 53  No orders of the defined types were placed in this encounter.   Follow-up: No follow-ups on file.    Forrest Moron, MD

## 2020-01-20 ENCOUNTER — Ambulatory Visit: Payer: BC Managed Care – PPO | Admitting: Registered Nurse

## 2020-01-20 ENCOUNTER — Other Ambulatory Visit: Payer: Self-pay

## 2020-01-20 ENCOUNTER — Encounter: Payer: Self-pay | Admitting: Registered Nurse

## 2020-01-20 VITALS — BP 136/80 | HR 88 | Temp 97.9°F | Ht 70.0 in | Wt 312.4 lb

## 2020-01-20 DIAGNOSIS — L03213 Periorbital cellulitis: Secondary | ICD-10-CM

## 2020-01-20 MED ORDER — DOXYCYCLINE HYCLATE 100 MG PO TABS: 100.0000 mg | ORAL_TABLET | Freq: Two times a day (BID) | ORAL | 0 refills | Status: DC

## 2020-01-20 NOTE — Patient Instructions (Signed)
° ° ° °  If you have lab work done today you will be contacted with your lab results within the next 2 weeks.  If you have not heard from us then please contact us. The fastest way to get your results is to register for My Chart. ° ° °IF you received an x-ray today, you will receive an invoice from [facility]. Please contact [facility] at [phone] with questions or concerns regarding your invoice.  ° °IF you received labwork today, you will receive an invoice from [facility]. Please contact [facility] at [phone] with questions or concerns regarding your invoice.  ° °Our billing staff will not be able to assist you with questions regarding bills from these companies. ° °You will be contacted with the lab results as soon as they are available. The fastest way to get your results is to activate your My Chart account. Instructions are located on the last page of this paperwork. If you have not heard from us regarding the results in 2 weeks, please contact this office. °  ° ° ° °

## 2020-01-20 NOTE — Progress Notes (Signed)
Acute Office Visit  Subjective:    Patient ID: Victoria Ewing, female    DOB: [birth year], 44 y.o.   MRN: [id number]  Chief Complaint  Patient presents with  . left face swelling    with the lymph nodes. going on for last 2-3 days and started with the stye     HPI Patient is in today for facial swelling Started with stye in eye, pt applied warm compresses a few times a day, this started 2-3 days ago Woke up today with left side of face, particularly area around eye swollen and tender Tenderness in particular anterior to ear No changes to hearing or vision. No pain in eye No visible rash, but question of some erythema. No headaches, shob, doe, sinus pressure  Past Medical History:  Diagnosis Date  . Allergy   . Anxiety   . Asthma   . Depression   . Hypertension     Past Surgical History:  Procedure Laterality Date  . CESAREAN SECTION    . lipoma removal      Family History  Problem Relation Age of Onset  . Cancer Mother        pancreatic  . Stroke Mother 39  . Diabetes Mother   . Kidney disease Mother        transplant in 2010  . Hypertension Mother   . Transient ischemic attack Father 60  . Cancer Father        prostate cancer?  . Stroke Maternal Grandmother 53  . Hypertension Sister 4  . Stroke Cousin 45  . Rheum arthritis Cousin     Social History   Socioeconomic History  . Marital status: Single    Spouse name: Not on file  . Number of children: Not on file  . Years of education: Not on file  . Highest education level: Not on file  Occupational History  . Not on file  Tobacco Use  . Smoking status: Never Smoker  . Smokeless tobacco: Never Used  Substance and Sexual Activity  . Alcohol use: Yes    Alcohol/week: 0.0 standard drinks  . Drug use: No  . Sexual activity: Not Currently  Other Topics Concern  . Not on file  Social History Narrative  . Not on file   Social Determinants of Health   Financial Resource Strain:   .  Difficulty of Paying Living Expenses:   Food Insecurity:   . Worried About Charity fundraiser in the Last Year:   . Arboriculturist in the Last Year:   Transportation Needs:   . Film/video editor (Medical):   Marland Kitchen Lack of Transportation (Non-Medical):   Physical Activity:   . Days of Exercise per Week:   . Minutes of Exercise per Session:   Stress:   . Feeling of Stress :   Social Connections:   . Frequency of Communication with Friends and Family:   . Frequency of Social Gatherings with Friends and Family:   . Attends Religious Services:   . Active Member of Clubs or Organizations:   . Attends Archivist Meetings:   Marland Kitchen Marital Status:   Intimate Partner Violence:   . Fear of Current or Ex-Partner:   . Emotionally Abused:   Marland Kitchen Physically Abused:   . Sexually Abused:     Outpatient Medications Prior to Visit  Medication Sig Dispense Refill  . albuterol (PROVENTIL HFA;VENTOLIN HFA) 108 (90 Base) MCG/ACT inhaler Inhale 2 puffs into the lungs every  6 (six) hours as needed for wheezing. 1 Inhaler 1  . ALPRAZolam (XANAX) 0.25 MG tablet TAKE 1 TO 1 & 1/2 TABLETS ONCE DAILY AS NEEDED. 45 tablet 0  . amLODipine (NORVASC) 5 MG tablet Take 0.5 tablets (2.5 mg total) by mouth in the morning and at bedtime. 90 tablet 1  . Levocetirizine Dihydrochloride (XYZAL ALLERGY 24HR PO)     . triamcinolone (NASACORT) 55 MCG/ACT AERO nasal inhaler 1 spray at bedtime.    . triamcinolone cream (KENALOG) 0.1 % Apply 1 application topically 2 (two) times daily. 30 g 5  . Triamcinolone Acetonide (NASACORT ALLERGY 24HR NA) Place into the nose.     No facility-administered medications prior to visit.    Allergies  Allergen Reactions  . Other Anaphylaxis    Pecans and nuts   . Effexor Xr [Venlafaxine Hcl Er]     Review of Systems Per hpi      Objective:    Physical Exam Vitals and nursing note reviewed.  Constitutional:      General: She is not in acute distress.    Appearance:  Normal appearance. She is obese. She is not ill-appearing or diaphoretic.  HENT:     Head: Normocephalic and atraumatic. Left periorbital erythema present. No raccoon eyes, Battle's sign, abrasion, contusion, masses or laceration. Hair is normal.     Right Ear: Tympanic membrane, ear canal and external ear normal. There is no impacted cerumen.     Left Ear: Tympanic membrane, ear canal and external ear normal. There is no impacted cerumen.     Nose: Nose normal.     Mouth/Throat:     Mouth: Mucous membranes are moist.     Pharynx: Oropharynx is clear. No oropharyngeal exudate or posterior oropharyngeal erythema.  Eyes:     General: No scleral icterus.       Right eye: No discharge.        Left eye: No discharge.     Extraocular Movements: Extraocular movements intact.     Conjunctiva/sclera: Conjunctivae normal.     Pupils: Pupils are equal, round, and reactive to light.  Skin:    General: Skin is warm and dry.     Capillary Refill: Capillary refill takes less than 2 seconds.     Coloration: Skin is not jaundiced or pale.     Findings: Erythema present. No bruising, lesion or rash.  Neurological:     General: No focal deficit present.     Mental Status: She is alert and oriented to person, place, and time. Mental status is at baseline.     Cranial Nerves: No cranial nerve deficit.  Psychiatric:        Mood and Affect: Mood normal.        Behavior: Behavior normal.        Thought Content: Thought content normal.        Judgment: Judgment normal.     BP 136/80   Pulse 88   Temp 97.9 F (36.6 C) (Temporal)   Ht 5\' 10"  (1.778 m)   Wt (!) 312 lb 6.4 oz (141.7 kg)   LMP 01/14/2020   SpO2 96%   BMI 44.82 kg/m  Wt Readings from Last 3 Encounters:  01/20/20 (!) 312 lb 6.4 oz (141.7 kg)  12/02/19 (!) 312 lb (141.5 kg)  11/04/19 (!) 312 lb 12.8 oz (141.9 kg)    Health Maintenance Due  Topic Date Due  . PAP SMEAR-Modifier  01/01/2020    There are no preventive  care reminders  to display for this patient.   Lab Results  Component Value Date   TSH 1.110 11/04/2019   Lab Results  Component Value Date   WBC 5.8 08/20/2018   HGB 12.6 08/20/2018   HCT 41.9 08/20/2018   MCV 89.1 08/20/2018   PLT 338 08/20/2018   Lab Results  Component Value Date   NA 141 11/04/2019   K 4.4 11/04/2019   CO2 22 11/04/2019   GLUCOSE 102 (H) 11/04/2019   BUN 13 11/04/2019   CREATININE 0.86 11/04/2019   BILITOT 0.3 11/04/2019   ALKPHOS 56 11/04/2019   AST 20 11/04/2019   ALT 19 11/04/2019   PROT 7.0 11/04/2019   ALBUMIN 4.3 11/04/2019   CALCIUM 9.2 11/04/2019   ANIONGAP 7 08/20/2018   Lab Results  Component Value Date   CHOL 142 11/04/2019   Lab Results  Component Value Date   HDL 50 11/04/2019   Lab Results  Component Value Date   LDLCALC 75 11/04/2019   Lab Results  Component Value Date   TRIG 93 11/04/2019   Lab Results  Component Value Date   CHOLHDL 2.8 11/04/2019   Lab Results  Component Value Date   HGBA1C 5.7 (H) 11/04/2019       Assessment & Plan:   Problem List Items Addressed This Visit    None    Visit Diagnoses    Periorbital cellulitis of left eye    -  Primary   Relevant Medications   doxycycline (VIBRA-TABS) 100 MG tablet       Meds ordered this encounter  Medications  . doxycycline (VIBRA-TABS) 100 MG tablet    Sig: Take 1 tablet (100 mg total) by mouth 2 (two) times daily.    Dispense:  14 tablet    Refill:  0    Order Specific Question:   Supervising Provider    Answer:   Forrest Moron [id number]   PLAN  Concern for periorbital cellulitis. No rash, no concern at this time for zoster.  Encourage her to continue warm compresses, start doxycycline 100mg  PO bid for 7 days,  Strict ER precautions given  Patient encouraged to call clinic with any questions, comments, or concerns.   Maximiano Coss, NP

## 2020-02-02 ENCOUNTER — Encounter: Payer: BC Managed Care – PPO | Attending: Family Medicine | Admitting: Registered"

## 2020-02-02 ENCOUNTER — Other Ambulatory Visit: Payer: Self-pay

## 2020-02-02 ENCOUNTER — Encounter: Payer: Self-pay | Admitting: Registered"

## 2020-02-02 DIAGNOSIS — R7303 Prediabetes: Secondary | ICD-10-CM | POA: Insufficient documentation

## 2020-02-02 LAB — HM DIABETES EYE EXAM

## 2020-02-02 NOTE — Patient Instructions (Addendum)
-   Aim to have 1/2 plate non-starchy vegetables with lunch and dinner.   - Continue having 3 meals a day and snacks as needed.   - Balance carbohydrates with protein such as fruit + cheese, greek yogurt, cheese and crackers.   - Aim to increase movement with walking about 2x/day of at least 20 minutes.

## 2020-02-02 NOTE — Progress Notes (Signed)
Medical Nutrition Therapy:  Appt start time: 11:15 end time: 12:03.   Assessment:  Primary concerns today: Per recent labs, A1c was 5.7.   Pt expectations: instructions, better understanding related to food  Pt arrives with daughter. States she likes to snack. States she is not sleeping well, wakes up throughout the night. States she feels tired at the end of her workday.   Works as Oceanographer.   Preferred Learning Style:   No preference indicated   Learning Readiness:   Not ready  Contemplating  Ready  Change in progress   MEDICATIONS: See list   DIETARY INTAKE:  Usual eating pattern includes 3 meals and 2 snacks per day.  Everyday foods include chicken, [place], rice, pasta, vegetables, chips. Avoided foods include nuts, brussels sprouts, yams, peanut butter.    24-hr recall:  B ( AM): 2 microwave mini sausage biscuits Snk ( AM): baked chips + peach green tea  L ( PM): Viva chicken - 1/4 rotisserie chicken + green beans + fried rice Snk ( PM): baked chips + cupcake D ( PM): grilled chicken breast + a few pieces of shrimp + fried rice Snk ( PM): none Beverages: peach green tea, water, lactose-free 1% milk  Usual physical activity: none reported  Estimated energy needs: 1800 calories 200 g carbohydrates 135 g protein 50 g fat  Progress Towards Goal(s):  In progress.   Nutritional Diagnosis:  NB-1.1 Food and nutrition-related knowledge deficit As related to prediabetes.  As evidenced by pt verbalizes incomplete knowledge.    Intervention:  Nutrition education and counseling provided. Pt was educated and counseled on prediabetes, how carbohydrates work in the body, A1c ranges for prediabetes, role of fiber in eating regimen, and importance of movement. Discussed meal/snack planning and how to balance meals/snacks using MyPlate for prediabetes. Pt was in agreement with goals listed. Goals: - Aim to have 1/2 plate non-starchy vegetables with lunch  and dinner.  - Continue having 3 meals a day and snacks as needed.  - Balance carbohydrates with protein such as fruit + cheese, greek yogurt, cheese and crackers.  - Aim to increase movement with walking about 2x/day of at least 20 minutes.   Teaching Method Utilized:  Visual Auditory Hands on  Handouts given during visit include:  MyPlate for Prediabetes  Barriers to learning/adherence to lifestyle change: none identified  Demonstrated degree of understanding via:  Teach Back   Monitoring/Evaluation:  Dietary intake, exercise, and body weight prn.

## 2020-02-23 ENCOUNTER — Other Ambulatory Visit: Payer: Self-pay | Admitting: Family Medicine

## 2020-02-23 NOTE — Telephone Encounter (Signed)
Requested medication (s) are due for refill today: yes  Requested medication (s) are on the active medication list: yes  Last refill:  226/21 #45  Future visit scheduled: no  Notes to clinic:  Refill not delegated per protocol. Former patient of Dr. Nolon Rod    Requested Prescriptions  Pending Prescriptions Disp Refills   ALPRAZolam (XANAX) 0.25 MG tablet [Pharmacy Med Name: ALPRAZOLAM 0.25 MG TABLET] 45 tablet 0    Sig: TAKE 1 TO 1 & 1/2 TABLETS ONCE DAILY AS NEEDED.      Not Delegated - Psychiatry:  Anxiolytics/Hypnotics Failed - 02/23/2020  5:16 PM      Failed - This refill cannot be delegated      Failed - Urine Drug Screen completed in last 360 days.      Passed - Valid encounter within last 6 months    Recent Outpatient Visits           1 month ago Periorbital cellulitis of left eye   [facility], Delfino Lovett, NP   2 months ago Prediabetes   [facility] at [facility], Arlie Solomons, MD   3 months ago Encounter for health maintenance examination in adult   [facility] at [facility], Arlie Solomons, MD   4 months ago Atypical mole   Primary Care at [facility], Arlie Solomons, MD   1 year ago Chronic ethmoidal sinusitis   Primary Care at [facility], Arlie Solomons, MD

## 2020-02-24 NOTE — Telephone Encounter (Signed)
Patient is requesting a refill of the following medications: Requested Prescriptions   Pending Prescriptions Disp Refills  . ALPRAZolam (XANAX) 0.25 MG tablet [Pharmacy Med Name: ALPRAZOLAM 0.25 MG TABLET] 45 tablet 0    Sig: TAKE 1 TO 1 & 1/2 TABLETS ONCE DAILY AS NEEDED.    Date of patient request:02/24/2020 Last office visit: 12/02/2019 Date of last refill:12/02/2019 Last refill amount: 45 Follow up time period per chart: n/a

## 2020-02-24 NOTE — Telephone Encounter (Signed)
Patient is requesting a refill of the following medications: Requested Prescriptions   Pending Prescriptions Disp Refills  . ALPRAZolam (XANAX) 0.25 MG tablet [Pharmacy Med Name: ALPRAZOLAM 0.25 MG TABLET] 45 tablet 0    Sig: TAKE 1 TO 1 & 1/2 TABLETS ONCE DAILY AS NEEDED.    Date of patient request: 02/24/20 Last office visit: 01/20/20 w/ Denice Paradise and 12/02/19 w/ Nolon Rod  Date of last refill: 226/21 Last refill amount: 45 Follow up time period per chart: non scheduled yet

## 2020-04-10 ENCOUNTER — Other Ambulatory Visit: Payer: Self-pay | Admitting: Family Medicine

## 2020-04-10 DIAGNOSIS — R221 Localized swelling, mass and lump, neck: Secondary | ICD-10-CM

## 2020-04-13 ENCOUNTER — Ambulatory Visit
Admission: RE | Admit: 2020-04-13 | Discharge: 2020-04-13 | Disposition: A | Discharge status: Home / Self Care | Payer: BC Managed Care – PPO | Source: Ambulatory Visit | Attending: Family Medicine | Admitting: Family Medicine

## 2020-04-13 DIAGNOSIS — R221 Localized swelling, mass and lump, neck: Secondary | ICD-10-CM

## 2021-02-06 IMAGING — US US SOFT TISSUE HEAD/NECK
1 series · 14 of 19 positions shown · non-contrast
Comparison: None.

CLINICAL DATA: Right submandibular region pain and swelling over
the last month.

EXAM:
ULTRASOUND OF HEAD/NECK SOFT TISSUES
TECHNIQUE: Ultrasound examination of the head and neck soft tissues was
performed in the area of clinical concern.

[Series 1: us soft tissue head/neck · 0.07mm/px · 14 of 19 slices shown]
[im 1/19]
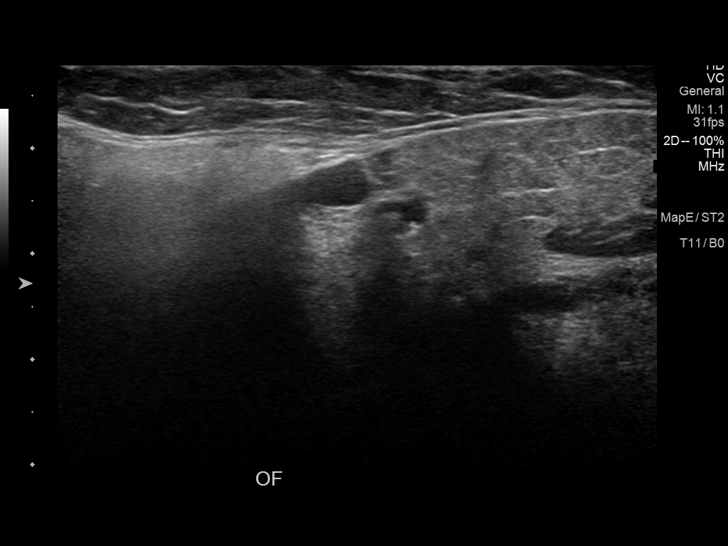
[im 3/19]
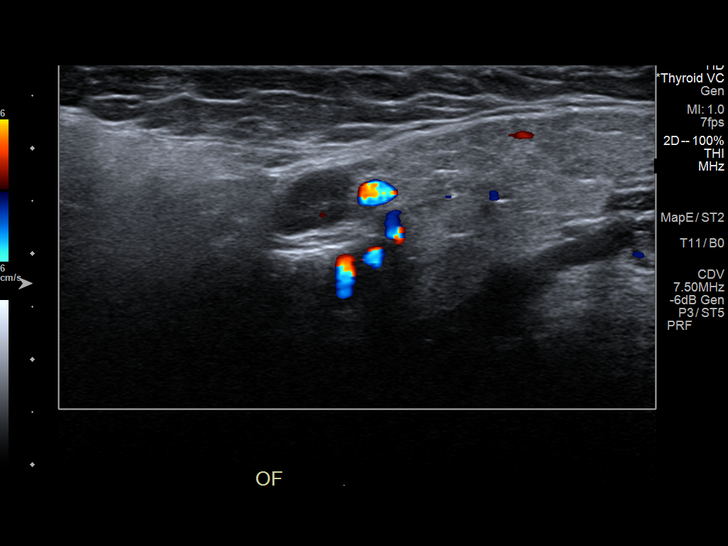
[im 4/19]
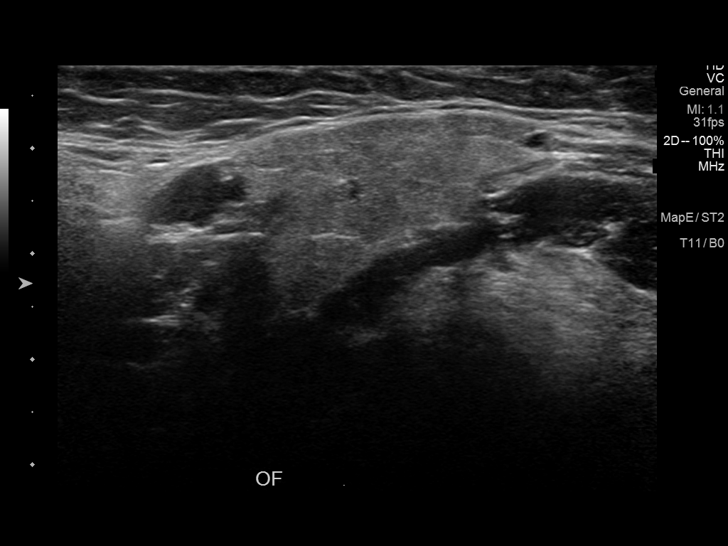
[im 5/19]
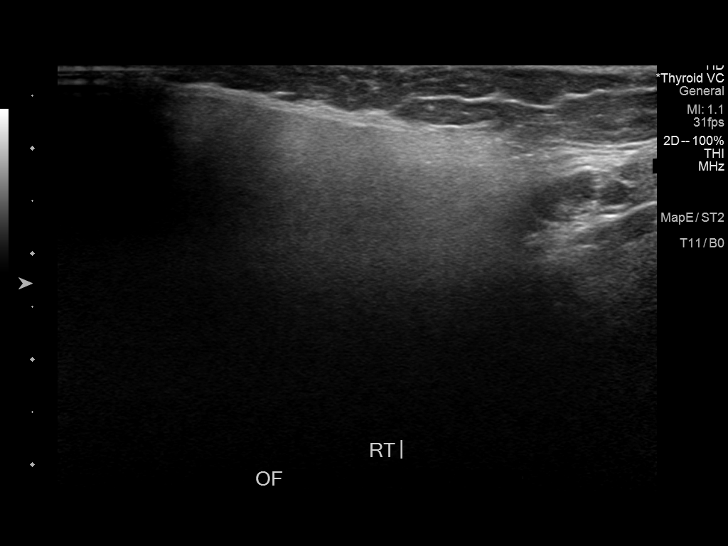
[im 7/19]
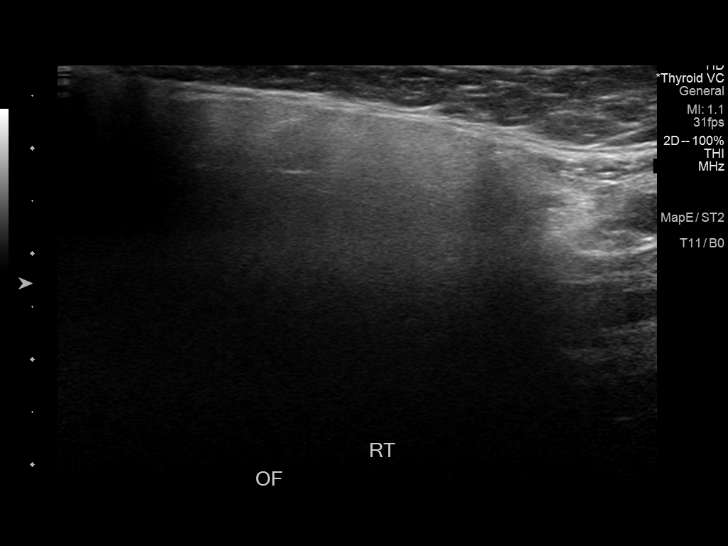
[im 8/19]
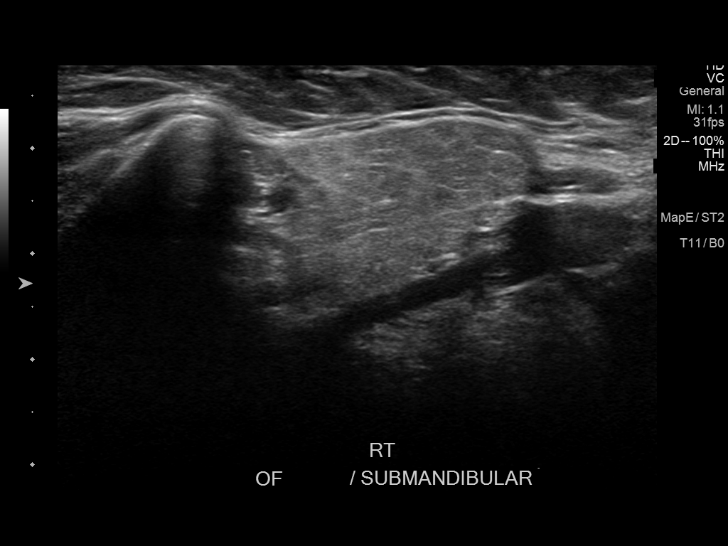
[im 9/19]
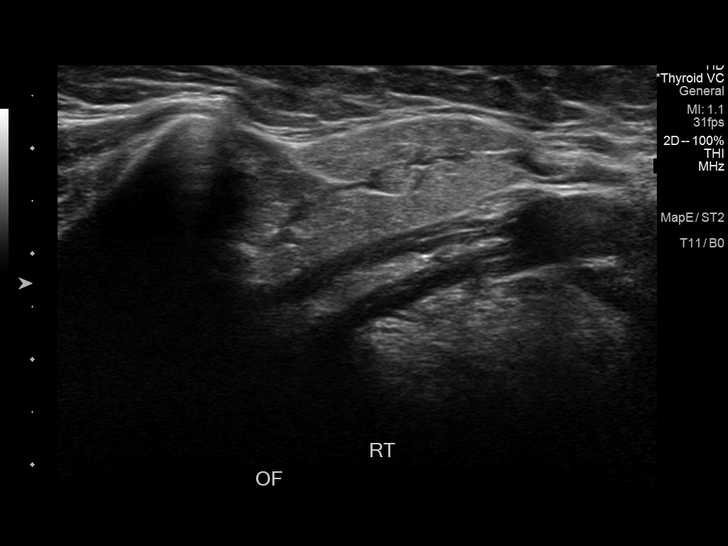
[im 11/19]
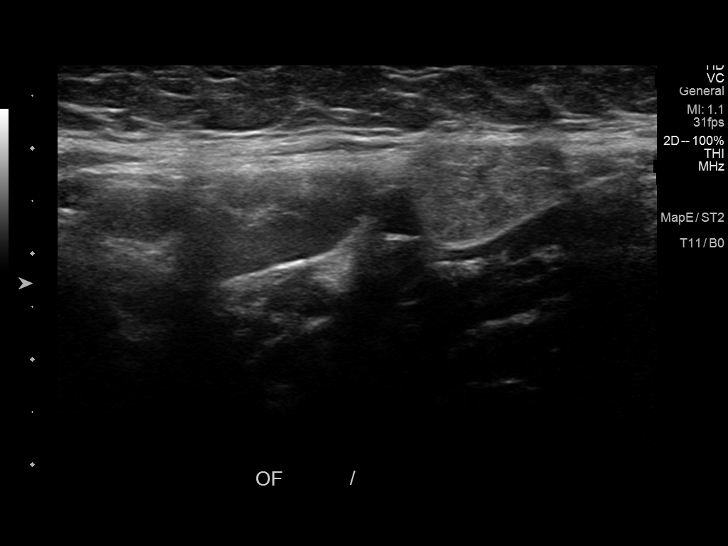
[im 12/19]
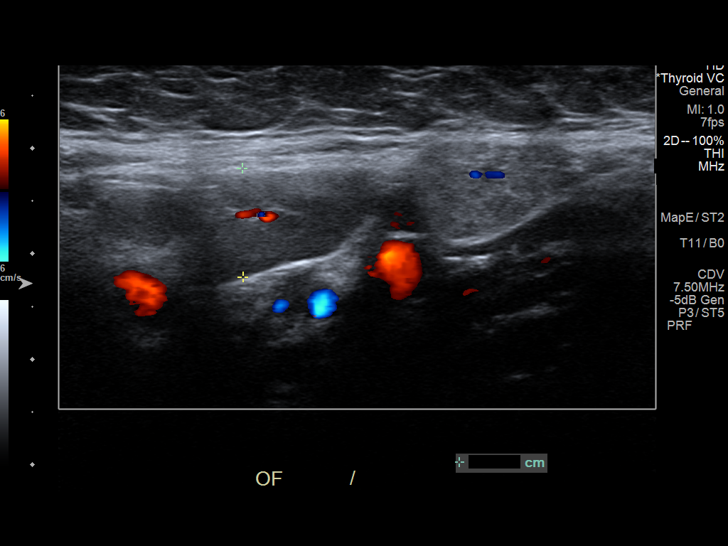
[im 13/19]
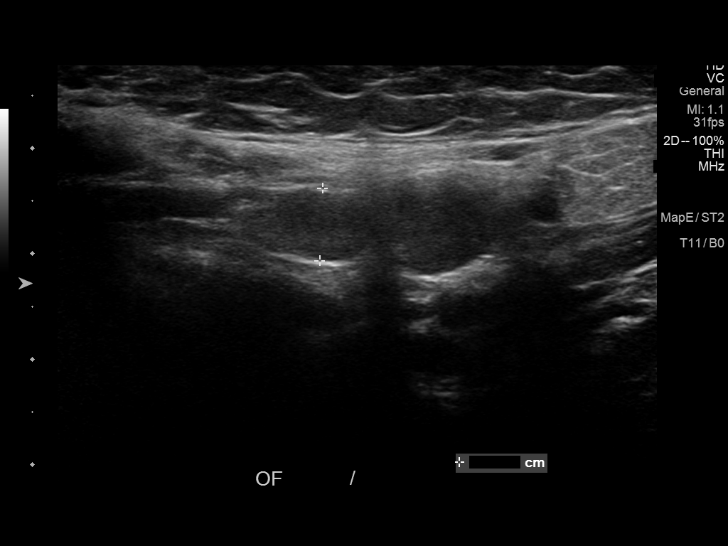
[im 15/19]
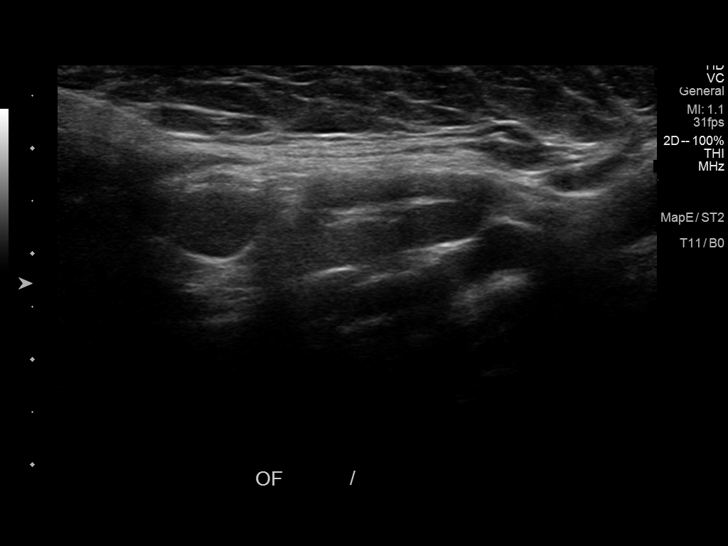
[im 16/19]
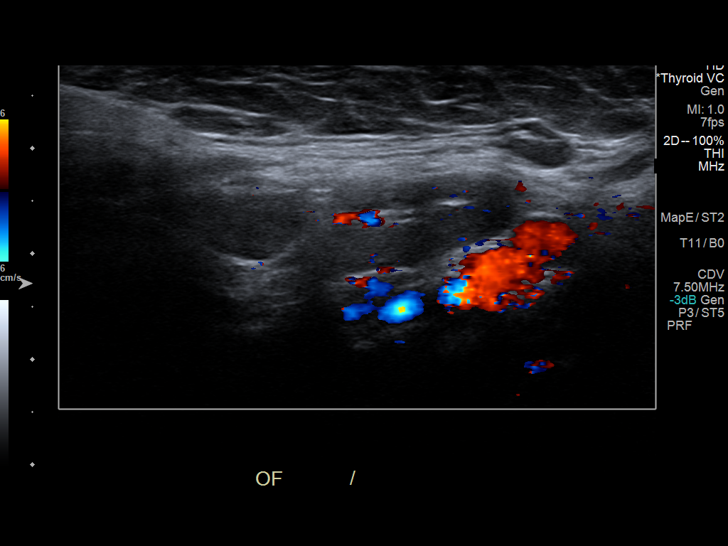
[im 17/19]
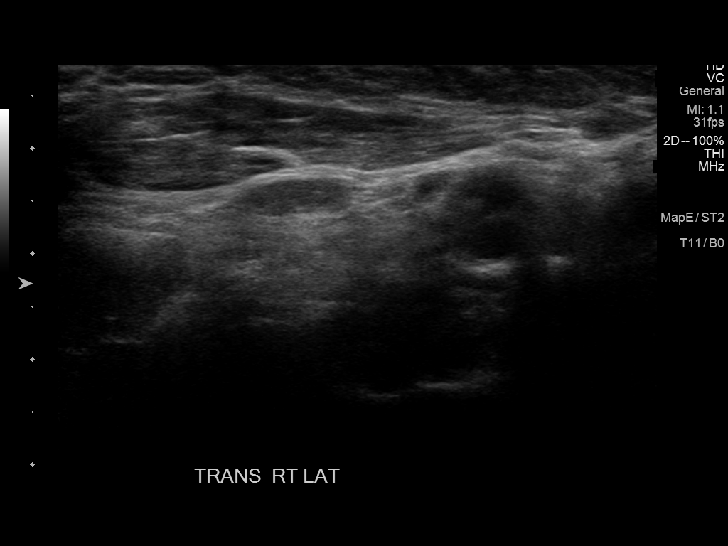
[im 19/19]
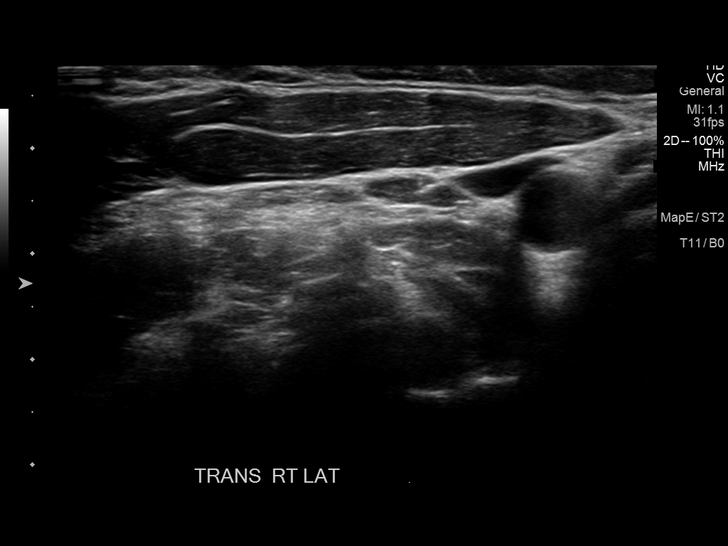

[14 of 19 positions shown; findings below may reference images not displayed]

FINDINGS: Normal appearance of the right submandibular region. The right
parotid and submandibular glands appear normal by sonography. No
evidence of mass or ductal dilatation. No evidence of enlarged or
inflamed lymph nodes by sonography.
IMPRESSION: No pathologic finding seen in the region of concern. Normal
appearing parotid and submandibular glands and normal appearing
regional lymph nodes.

## 2021-08-09 DIAGNOSIS — Z9109 Other allergy status, other than to drugs and biological substances: Secondary | ICD-10-CM | POA: Insufficient documentation

## 2021-08-09 DIAGNOSIS — J4520 Mild intermittent asthma, uncomplicated: Secondary | ICD-10-CM | POA: Insufficient documentation

## 2021-08-09 DIAGNOSIS — F411 Generalized anxiety disorder: Secondary | ICD-10-CM | POA: Insufficient documentation

## 2021-10-06 HISTORY — PX: BUNIONECTOMY: SHX129

## 2022-01-16 DIAGNOSIS — D6859 Other primary thrombophilia: Secondary | ICD-10-CM | POA: Insufficient documentation

## 2022-11-09 ENCOUNTER — Telehealth: Payer: Self-pay | Admitting: Family

## 2022-11-09 DIAGNOSIS — J069 Acute upper respiratory infection, unspecified: Secondary | ICD-10-CM

## 2022-11-09 MED ORDER — BENZONATATE 100 MG PO CAPS: 100.0000 mg | ORAL_CAPSULE | Freq: Three times a day (TID) | ORAL | 0 refills | Status: DC | PRN

## 2022-11-09 MED ORDER — FLUTICASONE PROPIONATE 50 MCG/ACT NA SUSP: 2.0000 | Freq: Every day | NASAL | 6 refills | Status: DC

## 2022-11-09 NOTE — Progress Notes (Signed)

## 2024-01-20 ENCOUNTER — Telehealth

## 2024-02-22 LAB — HM MAMMOGRAPHY

## 2024-02-25 ENCOUNTER — Ambulatory Visit (INDEPENDENT_AMBULATORY_CARE_PROVIDER_SITE_OTHER): Payer: Self-pay | Admitting: Allergy & Immunology

## 2024-02-25 ENCOUNTER — Other Ambulatory Visit: Payer: Self-pay

## 2024-02-25 ENCOUNTER — Encounter: Payer: Self-pay | Admitting: Allergy & Immunology

## 2024-02-25 VITALS — BP 130/88 | HR 76 | Temp 98.5°F | Resp 16 | Ht 68.9 in | Wt 289.2 lb

## 2024-02-25 DIAGNOSIS — J310 Chronic rhinitis: Secondary | ICD-10-CM | POA: Diagnosis not present

## 2024-02-25 DIAGNOSIS — J4530 Mild persistent asthma, uncomplicated: Secondary | ICD-10-CM | POA: Diagnosis not present

## 2024-02-25 DIAGNOSIS — L2089 Other atopic dermatitis: Secondary | ICD-10-CM

## 2024-02-25 MED ORDER — MONTELUKAST SODIUM 10 MG PO TABS: 10.0000 mg | ORAL_TABLET | Freq: Every day | ORAL | 1 refills | Status: DC

## 2024-02-25 MED ORDER — ZYRTEC ALLERGY 10 MG PO CAPS: 10.0000 mg | ORAL_CAPSULE | Freq: Every day | ORAL | 1 refills | Status: AC | PRN

## 2024-02-25 MED ORDER — RYALTRIS 665-25 MCG/ACT NA SUSP: 2.0000 | Freq: Two times a day (BID) | NASAL | 1 refills | Status: AC

## 2024-02-25 NOTE — Patient Instructions (Addendum)
 1. Chronic rhinitis - Because of insurance stipulations, we cannot do skin testing on the same day as your first visit. - We are all working to fight this, but for now we need to do two separate visits.  - We will know more after we do testing at the next visit.  - The skin testing visit can be squeezed in at your convenience.  - Then we can make a more full plan to address all of your symptoms. - Be sure to stop your antihistamines for 3 days before this appointment.  - Continue with cetirizine  in the meantime. - Add on montelukast  10mg  daily. - Add on Ryaltris one spray per nostril twice daily.   2. Mild persistent asthma, uncomplicated - Lung testing looked amazing today. - I do not think that we need to take anything on a routine basis. - Maybe the allergy testing will be enlightening and we can limit exposures to control your symptoms.   3. Flexural atopic dermatitis - Skin looks awesome today. - Your have an excellent skin regimen evidently.  - We can always restart the topical steroids if you need them. - However, you look superb today!   4. Abdominal pain - with concern for food testing - We will do testing to the most common foods (peanut, sesame, cashew, soy, fish mix, shellfish mix, wheat, milk, casein, egg)  5. Return in about 1 week (around 03/03/2024) for ALLERGY TESTING (1-55 + 1-17). You can have the follow up appointment with Dr. Idolina Maker or a Nurse Practicioner (our Nurse Practitioners are excellent and always have Physician oversight!).    Please inform us  of any Emergency Department visits, hospitalizations, or changes in symptoms. Call us  before going to the ED for breathing or allergy symptoms since we might be able to fit you in for a sick visit. Feel free to contact us  anytime with any questions, problems, or concerns.  It was a pleasure to see you again today!  Websites that have reliable patient information: 1. American Academy of Asthma, Allergy, and  Immunology: www.aaaai.org 2. Food Allergy Research and Education (FARE): foodallergy.org 3. Mothers of Asthmatics: http://www.asthmacommunitynetwork.org 4. American College of Allergy, Asthma, and Immunology: www.acaai.org      "Like" us  on Facebook and Instagram for our latest updates!      A healthy democracy works best when [employer] participate! Make sure you are registered to vote! If you have moved or changed any of your contact information, you will need to get this updated before voting! Scan the QR codes below to learn more!

## 2024-02-25 NOTE — Progress Notes (Signed)
 NEW PATIENT  Date of Service/Encounter:  02/25/24  Consult requested by: Stallings, Zoe A, MD (Inactive)   Assessment:   Chronic rhinitis - planning for skin testing at the next visit  Mild persistent asthma, uncomplicated   Flexural atopic dermatitis  Abdominal pain - with concerns of a food allergy  Plan/Recommendations:    1. Chronic rhinitis - Because of insurance stipulations, we cannot do skin testing on the same day as your first visit. - We are all working to fight this, but for now we need to do two separate visits.  - We will know more after we do testing at the next visit.  - The skin testing visit can be squeezed in at your convenience.  - Then we can make a more full plan to address all of your symptoms. - Be sure to stop your antihistamines for 3 days before this appointment.  - Continue with cetirizine  in the meantime. - Add on montelukast  10mg  daily. - Add on Ryaltris one spray per nostril twice daily.   2. Mild persistent asthma, uncomplicated - Lung testing looked amazing today. - I do not think that we need to take anything on a routine basis. - Maybe the allergy testing will be enlightening and we can limit exposures to control your symptoms.   3. Flexural atopic dermatitis - Skin looks awesome today. - Your have an excellent skin regimen evidently.  - We can always restart the topical steroids if you need them. - However, you look superb today!   4. Abdominal pain - with concern for food testing - We will do testing to the most common foods (peanut, sesame, cashew, soy, fish mix, shellfish mix, wheat, milk, casein, egg)  5. Return in about 1 week (around 03/03/2024) for ALLERGY TESTING (1-55 + 1-17). You can have the follow up appointment with Dr. Idolina Maker or a Nurse Practicioner (our Nurse Practitioners are excellent and always have Physician oversight!).   This note in its entirety was forwarded to the Provider who requested this  consultation.  Subjective:   Victoria Ewing is a 48 y.o. female presenting today for evaluation of  Chief Complaint  Patient presents with   Asthma    Says she is doing better. No flare ups in years. April began having chest tightness during the weather changed.    Allergic Rhinitis     Seasonal allergies. Congestion, runny nose, post nasal drip. Says she has itching on the ears and throat.    Victoria Ewing has a history of the following: Patient Active Problem List   Diagnosis Date Noted   Acute pharyngitis 10/31/2017   Sore throat 10/31/2017   Exposure to strep throat 10/31/2017   Exposure to influenza 10/31/2017   Cough 08/05/2017   Fever 08/05/2017   Lower respiratory infection 08/05/2017   BMI 40.0-44.9, adult (HCC) 03/05/2017   Eczema 12/14/2014   Asthma, chronic 08/30/2014   Anxiety state 07/22/2013   Allergic rhinitis 07/22/2013    History obtained from: chart review and patient.  Discussed the use of AI scribe software for clinical note transcription with the patient and/or guardian, who gave verbal consent to proceed.  Victoria Ewing was referred by Stallings, Zoe A, MD (Inactive).     Victoria Ewing is a 48 y.o. female presenting for an evaluation of asthma and allergies as well as her concern for food allergies.  Asthma/Respiratory Symptom History: She has a long-standing history of seasonal allergies and asthma since childhood, with a recent significant  flare-up of asthma symptoms. She experienced chest tightness and wheezing, particularly at night, which necessitated the use of albuterol  and a course of prednisone . She has not had asthma flare-ups in years prior to this season and does not use daily asthma medication.  Allergic Rhinitis Symptom History: Her primary concern is severe seasonal allergies, causing constant sneezing, itchy eyes, ears, and postnasal drip. She takes cetirizine  every night and occasionally uses diphenhydramine  for acute symptoms,  but these medications do not fully alleviate her symptoms. She describes episodes of sneezing so intense that her jaw hurts.  Food Allergy Symptom History: She suspects possible food allergies, particularly to tree nuts, as she had a reaction to pecans in the past. She consumes peanuts without issue but avoids other nuts.  Skin Symptom History: She has a history of eczema, which flared significantly last year, requiring treatment with triamcinolone  and clobetasol. Currently, she experiences occasional mild flare-ups, particularly in the winter, but has not needed regular use of these medications recently.  Otherwise, there is no history of other atopic diseases, including drug allergies, stinging insect allergies, or contact dermatitis. There is no significant infectious history. Vaccinations are up to date.    Past Medical History: Patient Active Problem List   Diagnosis Date Noted   Acute pharyngitis 10/31/2017   Sore throat 10/31/2017   Exposure to strep throat 10/31/2017   Exposure to influenza 10/31/2017   Cough 08/05/2017   Fever 08/05/2017   Lower respiratory infection 08/05/2017   BMI 40.0-44.9, adult (HCC) 03/05/2017   Eczema 12/14/2014   Asthma, chronic 08/30/2014   Anxiety state 07/22/2013   Allergic rhinitis 07/22/2013    Medication List:  Allergies as of 02/25/2024       Reactions   Victoria Ewing Shortness Of Breath   Venlafaxine  Other (See Comments)   Per pt 'medication induced panic attack'        Medication List        Accurate as of Feb 25, 2024 12:37 PM. If you have any questions, ask your nurse or doctor.          STOP taking these medications    albuterol  108 (90 Base) MCG/ACT inhaler Commonly known as: VENTOLIN  HFA Stopped by: Victoria Ewing   ALPRAZolam  0.25 MG tablet Commonly known as: XANAX  Stopped by: Victoria Ewing   amLODipine  5 MG tablet Commonly known as: NORVASC  Stopped by: Victoria Ewing   benzonatate  100  MG capsule Commonly known as: Tessalon  Perles Stopped by: Victoria Ewing   doxycycline  100 MG tablet Commonly known as: VIBRA -TABS Stopped by: Victoria Ewing   fluticasone  50 MCG/ACT nasal spray Commonly known as: FLONASE  Stopped by: Victoria Ewing   triamcinolone  55 MCG/ACT Aero nasal inhaler Commonly known as: NASACORT  Stopped by: Victoria Ewing   triamcinolone  cream 0.1 % Commonly known as: KENALOG  Stopped by: Moneka Mcquinn Louis Surabhi Gadea   XYZAL ALLERGY 24HR PO Stopped by: Victoria Ewing       TAKE these medications    clobetasol ointment 0.05 % Commonly known as: TEMOVATE Apply 1 Application topically 2 (two) times daily.   hydrocortisone  2.5 % ointment Apply 1 Application topically 2 (two) times daily.   montelukast  10 MG tablet Commonly known as: Singulair  Take 1 tablet (10 mg total) by mouth at bedtime. Started by: Ashyla Luth Louis Christabelle Hanzlik   Ryaltris 665-25 MCG/ACT Susp Generic drug: Olopatadine-Mometasone Place 2 sprays into the nose in the morning and at bedtime. Started by: Victoria Ewing   ZyrTEC   Allergy 10 MG Caps Generic drug: Cetirizine  HCl Take 1 capsule (10 mg total) by mouth daily as needed (Can take an extra dose during flare ups.).        Birth History: non-contributory  Developmental History: non-contributory  Past Surgical History: Past Surgical History:  Procedure Laterality Date   BUNIONECTOMY  2023   CESAREAN SECTION  01/28/2012   lipoma removal  2019     Family History: Family History  Problem Relation Age of Onset   Allergic rhinitis Mother    Cancer Mother        pancreatic   Stroke Mother 57   Diabetes Mother    Kidney disease Mother        transplant in 2010   Hypertension Mother    Transient ischemic attack Father 12   Cancer Father        prostate cancer?   Allergic rhinitis Sister    Allergic rhinitis Sister    Hypertension Sister 36   Stroke Maternal Grandmother 68   Stroke  Cousin 63   Rheum arthritis Cousin    Asthma Other      Social History: Dorinne lives at home with her family.  They live in an apartment that is around 46 years old.  There is wood laminate and rugs in the main living areas and carpeting in the bedroom.  They have electric heating and central cooling.  There is a dog inside of the home.  There are no dust mite covers on the bedding.  There is no tobacco exposure.  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  There is no fume, chemical, or dust exposure.  [place]..    Review of systems otherwise negative other than that mentioned in the HPI.    Objective:   Blood pressure 130/88, pulse 76, temperature 98.5 F (36.9 C), temperature source Temporal, resp. rate 16, height 5' 8.9" (1.75 m), weight 289 lb 3.2 oz (131.2 kg), SpO2 97%. Body mass index is 42.83 kg/m.     Physical Exam Vitals reviewed.  Constitutional:      Appearance: She is well-developed.     Comments: Very pleasant.  HENT:     Head: Normocephalic and atraumatic.     Right Ear: Tympanic membrane, ear canal and external ear normal. No drainage, swelling or tenderness. Tympanic membrane is not injected, scarred, erythematous, retracted or bulging.     Left Ear: Tympanic membrane, ear canal and external ear normal. No drainage, swelling or tenderness. Tympanic membrane is not injected, scarred, erythematous, retracted or bulging.     Nose: No nasal deformity, septal deviation, mucosal edema or rhinorrhea.     Right Turbinates: Enlarged, swollen and pale.     Left Turbinates: Enlarged, swollen and pale.     Right Sinus: No maxillary sinus tenderness or frontal sinus tenderness.     Left Sinus: No maxillary sinus tenderness or frontal sinus tenderness.     Mouth/Throat:     Lips: Pink.     Mouth: Mucous membranes are moist. Mucous membranes are not pale and not dry.     Pharynx: Uvula midline.      Comments: Cobblestoning in the posterior oropharynx. Eyes:     General: Lids are normal. Allergic shiner present.        Right eye: No discharge.        Left eye: No discharge.     Conjunctiva/sclera: Conjunctivae normal.  Right eye: Right conjunctiva is not injected. No chemosis.    Left eye: Left conjunctiva is not injected. No chemosis.    Pupils: Pupils are equal, round, and reactive to light.  Cardiovascular:     Rate and Rhythm: Normal rate and regular rhythm.     Heart sounds: Normal heart sounds.  Pulmonary:     Effort: Pulmonary effort is normal. No tachypnea, accessory muscle usage or respiratory distress.     Breath sounds: Normal breath sounds. No wheezing, rhonchi or rales.     Comments: Moving air well in all lung fields. Chest:     Chest wall: No tenderness.  Abdominal:     Tenderness: There is no abdominal tenderness. There is no guarding or rebound.  Lymphadenopathy:     Head:     Right side of head: No submandibular, tonsillar or occipital adenopathy.     Left side of head: No submandibular, tonsillar or occipital adenopathy.     Cervical: No cervical adenopathy.  Skin:    Coloration: Skin is not pale.     Findings: No abrasion, erythema, petechiae or rash. Rash is not papular, urticarial or vesicular.  Neurological:     Mental Status: She is alert.  Psychiatric:        Behavior: Behavior is cooperative.      Diagnostic studies:    Spirometry: results normal (FEV1: 2.93/104%, FVC: 3.53/101%, FEV1/FVC: .83%).   /Spirometry consistent with normal pattern.   Allergy Studies: deferred due to insurance stipulations that require a separate visit for testing          Drexel Gentles, MD [facility] 

## 2024-03-03 ENCOUNTER — Ambulatory Visit (INDEPENDENT_AMBULATORY_CARE_PROVIDER_SITE_OTHER): Payer: Self-pay | Admitting: Nurse Practitioner

## 2024-03-03 ENCOUNTER — Encounter: Payer: Self-pay | Admitting: Nurse Practitioner

## 2024-03-03 VITALS — BP 130/90 | HR 71 | Temp 98.2°F | Ht 68.0 in | Wt 292.8 lb

## 2024-03-03 DIAGNOSIS — F411 Generalized anxiety disorder: Secondary | ICD-10-CM

## 2024-03-03 DIAGNOSIS — J4530 Mild persistent asthma, uncomplicated: Secondary | ICD-10-CM | POA: Diagnosis not present

## 2024-03-03 DIAGNOSIS — R7309 Other abnormal glucose: Secondary | ICD-10-CM

## 2024-03-03 DIAGNOSIS — Z7689 Persons encountering health services in other specified circumstances: Secondary | ICD-10-CM | POA: Insufficient documentation

## 2024-03-03 DIAGNOSIS — Z79899 Other long term (current) drug therapy: Secondary | ICD-10-CM

## 2024-03-03 DIAGNOSIS — Z6841 Body Mass Index (BMI) 40.0 and over, adult: Secondary | ICD-10-CM

## 2024-03-03 DIAGNOSIS — I10 Essential (primary) hypertension: Secondary | ICD-10-CM

## 2024-03-03 DIAGNOSIS — Z1211 Encounter for screening for malignant neoplasm of colon: Secondary | ICD-10-CM

## 2024-03-03 DIAGNOSIS — Z1159 Encounter for screening for other viral diseases: Secondary | ICD-10-CM | POA: Insufficient documentation

## 2024-03-03 MED ORDER — ESCITALOPRAM OXALATE 10 MG PO TABS: 10.0000 mg | ORAL_TABLET | Freq: Every day | ORAL | 2 refills | Status: AC
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
## 2024-03-03 NOTE — Assessment & Plan Note (Signed)
 Recent flare-up. Start montelukast  as prescribed by allergist. Continue over-the-counter Zyrtec . Follow up with allergist as scheduled for allergy testing this summer.

## 2024-03-03 NOTE — Assessment & Plan Note (Signed)
 Long-standing anxiety with difficulty driving since 1610. Continue Lexapro  10 mg daily. Consider referral to mental health provider for additional support and possible cognitive behavioral therapy.

## 2024-03-03 NOTE — Assessment & Plan Note (Addendum)
 No current medications, blood pressure is slightly elevated today but this is her first visit to this office. Will see how it is with her next visit. Limit intake of high salt diet.

## 2024-03-04 LAB — CMP14+EGFR
ALT: 12 IU/L (ref 0–32)
AST: 15 IU/L (ref 0–40)
Albumin: 4.1 g/dL (ref 3.9–4.9)
Alkaline Phosphatase: 47 IU/L (ref 44–121)
BUN/Creatinine Ratio: 15 (ref 9–23)
BUN: 12 mg/dL (ref 6–24)
Bilirubin Total: 0.3 mg/dL (ref 0.0–1.2)
CO2: 18 mmol/L — ABNORMAL LOW (ref 20–29)
Calcium: 8.7 mg/dL (ref 8.7–10.2)
Chloride: 105 mmol/L (ref 96–106)
Creatinine, Ser: 0.81 mg/dL (ref 0.57–1.00)
Globulin, Total: 2.7 g/dL (ref 1.5–4.5)
Glucose: 98 mg/dL (ref 70–99)
Potassium: 4 mmol/L (ref 3.5–5.2)
Sodium: 139 mmol/L (ref 134–144)
Total Protein: 6.8 g/dL (ref 6.0–8.5)
eGFR: 91 mL/min/1.73

## 2024-03-04 LAB — CBC
Hematocrit: 38.7 % (ref 34.0–46.6)
Hemoglobin: 11.3 g/dL (ref 11.1–15.9)
MCH: 24.4 pg — ABNORMAL LOW (ref 26.6–33.0)
MCHC: 29.2 g/dL — ABNORMAL LOW (ref 31.5–35.7)
MCV: 83 fL (ref 79–97)
Platelets: 316 10*3/uL (ref 150–450)
RBC: 4.64 x10E6/uL (ref 3.77–5.28)
RDW: 14 % (ref 11.7–15.4)
WBC: 3.8 10*3/uL (ref 3.4–10.8)

## 2024-03-04 LAB — HEMOGLOBIN A1C
Est. average glucose Bld gHb Est-mCnc: 111 mg/dL
Hgb A1c MFr Bld: 5.5 % (ref 4.8–5.6)

## 2024-03-04 LAB — TSH: TSH: 1.06 u[IU]/mL (ref 0.450–4.500)

## 2024-03-04 LAB — HEPATITIS C ANTIBODY: Hep C Virus Ab: NONREACTIVE

## 2024-03-06 ENCOUNTER — Ambulatory Visit: Payer: Self-pay | Admitting: Nurse Practitioner

## 2024-03-06 DIAGNOSIS — Z1211 Encounter for screening for malignant neoplasm of colon: Secondary | ICD-10-CM | POA: Insufficient documentation

## 2024-03-06 NOTE — Assessment & Plan Note (Signed)
 Will check Hepatitis C screening due to recent recommendations to screen all adults 18 years and older

## 2024-03-06 NOTE — Assessment & Plan Note (Signed)
 Will check A1c. Encouraged to focus on healthy diet low in sugar and starches.

## 2024-03-06 NOTE — Assessment & Plan Note (Signed)

## 2024-03-06 NOTE — Assessment & Plan Note (Signed)
 She is encouraged to strive for BMI less than 30 to decrease cardiac risk. Advised to aim for at least 150 minutes of exercise per week.

## 2024-03-06 NOTE — Assessment & Plan Note (Signed)
 According to USPTF Colorectal cancer Screening guidelines. Colonoscopy is recommended every 10 years, starting at age 46 years. Will refer to GI for colon cancer screening.

## 2024-03-08 ENCOUNTER — Ambulatory Visit: Payer: Self-pay | Admitting: Allergy & Immunology

## 2024-04-05 NOTE — Patient Instructions (Incomplete)
 1. Chronic rhinitis - Skin testing today is - Copy of skin test given - Start avoidance measures as below - Continue with cetirizine  in the meantime. - Continue montelukast  10mg  daily. - Continue Ryaltris  one spray per nostril twice daily.   2. Mild persistent asthma, uncomplicated - Lung testing looked amazing at visit on 02/25/24 - I do not think that we need to take anything on a routine basis.   3. Flexural atopic dermatitis - Your have an excellent skin regimen evidently.  - We can always restart the topical steroids if you need them.  4. Abdominal pain - with concern for food testing - Skin testing to most common foods (peanut, sesame, cashew, soy, fish mix, shellfish mix, wheat, milk, casein, egg) are  5. Follow up in months or sooner if needed

## 2024-04-06 ENCOUNTER — Ambulatory Visit: Admitting: Family

## 2024-04-06 ENCOUNTER — Encounter: Payer: Self-pay | Admitting: Family

## 2024-04-06 DIAGNOSIS — R109 Unspecified abdominal pain: Secondary | ICD-10-CM

## 2024-04-06 DIAGNOSIS — T781XXA Other adverse food reactions, not elsewhere classified, initial encounter: Secondary | ICD-10-CM

## 2024-04-06 DIAGNOSIS — J302 Other seasonal allergic rhinitis: Secondary | ICD-10-CM

## 2024-04-06 DIAGNOSIS — T781XXD Other adverse food reactions, not elsewhere classified, subsequent encounter: Secondary | ICD-10-CM | POA: Diagnosis not present

## 2024-04-06 DIAGNOSIS — J3089 Other allergic rhinitis: Secondary | ICD-10-CM | POA: Diagnosis not present

## 2024-04-06 MED ORDER — EPINEPHRINE 0.3 MG/0.3ML IJ SOAJ: 0.3000 mg | INTRAMUSCULAR | 1 refills | Status: AC | PRN

## 2024-04-06 NOTE — Progress Notes (Signed)
 Date of Service/Encounter:  04/06/24  Allergy  testing appointment   Initial visit on 02/25/24, seen for chronic rhinitis mild persistent asthma and flexural atopic dermatitis.  Please see that note for additional details.  Today reports for allergy  diagnostic testing:    DIAGNOSTICS:  Skin Testing: Environmental allergy  panel and select foods. Adequate positive and negative controls Results discussed with patient/family.   Airborne Adult Perc - 04/06/24 0849     Time Antigen Placed 0849    Allergen Manufacturer Jestine    Location Back    Number of Test 55    1. Control-Buffer 50% Glycerol Negative    2. Control-Histamine 3+    3. Bahia Negative    4. [place] 2+    5. Johnson Negative    6. [place]  Blue Negative    7. Meadow Fescue Negative    8. Perennial Rye Negative    9. Timothy Negative    10. Ragweed Mix Negative    11. Cocklebur Negative    12. Plantain,  English 2+    13. Baccharis Negative    14. Dog Fennel Negative    15. Russian Thistle Negative    16. Lamb's Quarters Negative    17. Sheep Sorrell Negative    18. Rough Pigweed Negative    19. Marsh Elder, Rough Negative    20. Mugwort, Common Negative    21. Box, Elder 2+    22. Cedar, red Negative    23. Sweet Gum 2+    24. Pecan Pollen Negative    25. Pine Mix Negative    26. Walnut, Black Pollen Negative    27. Red Mulberry Negative    28. Ash Mix 2+    29. Birch Mix Negative    30. Beech American Negative    31. Cottonwood, Guinea[place]iss[place] Negative    32. Hickory, White Negative    33. Maple Mix Negative    34. Oak, [place] Mix 2+    35. Sycamore Eastern Negative    36. Alternaria Alternata 2+    37. Cladosporium Herbarum Negative    38. Aspergillus Mix Negative    39. Penicillium Mix Negative    40. Bipolaris Sorokiniana (Helminthosporium) Negative    41. Drechslera Spicifera (Curvularia) 2+    42. Mucor Plumbeus 2+    43. Fusarium Moniliforme Negative    44. Aureobasidium Pullulans (pullulara)  2+    45. Rhizopus Oryzae Negative    46. Botrytis Cinera Negative    47. Epicoccum Nigrum Negative    48. Phoma Betae Negative    49. Dust Mite Mix Negative    50. Cat Hair 10,000 BAU/ml Negative    51.  Dog Epithelia Negative    52. Mixed Feathers 2+    53. Horse Epithelia Negative    54. Cockroach, German Negative    55. Tobacco Leaf Negative          Intradermal - 04/06/24 1004     Time Antigen Placed 0957    Allergen Manufacturer Jestine    Location Arm    Number of Test 11    Intradermal Select    Control Negative    Bahia Negative    Johnson 3+    7 Grass 3+    Ragweed Mix 3+    Weed Mix 3+    Mold 2 3+    Mite Mix 4+    Cat 4+    Dog 4+    Cockroach Negative  Food Adult Perc - 04/06/24 0900     Time Antigen Placed 9150    Allergen Manufacturer Jestine    Location Back    Number of allergen test 17    1. Peanut Negative    2. Soybean Negative    3. Wheat Negative    4. Sesame Negative    5. Milk, Cow Negative    6. Casein Negative    7. Egg White, Chicken Negative    8. Shellfish Mix Negative    9. Fish Mix Negative    10. Cashew Negative    11. Walnut Food Negative    12. Almond Negative    13. Hazelnut Negative    14. Pecan Food Negative    15. Pistachio Negative    16. [place] Nut Negative    17. Coconut Negative               Allergy  testing results were read and interpreted by myself, documented by clinical staff.  Patient provided with copy of allergy  testing along with avoidance measures when indicated.   1. Allergic rhinitis - Skin testing today is positive to grass pollen, weed pollen, tree pollen, mold, and mixed feathers with adequate controls.  Intradermal skin testing is positive to Johnson grass, grass mix, ragweed mix, weed mix, major mold mix 2, dust mite mix, cat hair, and dog epithelia. - Copy of skin test given - Start avoidance measures as below - Continue with cetirizine  in the meantime. - Continue  montelukast  10mg  daily. - Continue Ryaltris  one spray per nostril twice daily.  - Consider allergy  injections as a means of long-term control. - Allergy  injections re-train and reset the immune system to ignore environmental allergens and decrease the resulting immune response to those allergens (sneezing, itchy watery eyes, runny nose, nasal congestion, etc).    - Allergy  injections improve symptoms in 75-85% of patients.   - We can discuss this more at the next appointment if the medications are not working for you.   2. Mild persistent asthma, uncomplicated - Lung testing looked amazing at visit on 02/25/24 - I do not think that we need to take anything on a routine basis.   3. Flexural atopic dermatitis - Your have an excellent skin regimen evidently.  - We can always restart the topical steroids if you need them.  4. 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 Went to urgent care and does not remember how she was treated. Does not have an EpiPen and avoiding all nuts. Later on in the visit she reports that she also avoids pineapple due to it causing itching in her mouth. She wonders if it could be oral allergy  syndrome - We will get lab work to nuts and pineapple. We will call you with results once they are back -Avoid nuts and pineapple for now - Emergency Action Plan given and reviewed - Demonstration given on how to use EpiPen  5. Follow up in 4-6 weeks or sooner if needed    Reducing Pollen Exposure The American Academy of Allergy , Asthma and Immunology suggests the following steps to reduce your exposure to pollen during allergy  seasons. Do not hang sheets or clothing out to dry; pollen may collect on these items. Do not  mow lawns  or spend time around freshly cut grass; mowing stirs up pollen. Keep windows closed at night.  Keep car windows closed while driving. Minimize morning activities outdoors, a time when pollen counts are usually at their highest. Stay indoors as much as possible when pollen counts or humidity is high and on windy days when pollen tends to remain in the air longer. Use air conditioning when possible.  Many air conditioners have filters that trap the pollen spores. Use a HEPA room air filter to remove pollen form the indoor air you breathe.  Control of Mold Allergen Mold and fungi can grow on a variety of surfaces provided certain temperature and moisture conditions exist.  Outdoor molds grow on plants, decaying vegetation and soil.  The major outdoor mold, Alternaria and Cladosporium, are found in very high numbers during hot and dry conditions.  Generally, a late Summer - Fall peak is seen for common outdoor fungal spores.  Rain will temporarily lower outdoor mold spore count, but counts rise rapidly when the rainy period ends.  The most important indoor molds are Aspergillus and Penicillium.  Dark, humid and poorly ventilated basements are ideal sites for mold growth.  The next most common sites of mold growth are the bathroom and the kitchen.  Outdoor [employer] Use air conditioning and keep windows closed Avoid exposure to decaying vegetation. Avoid leaf raking. Avoid grain handling. Consider wearing a face mask if working in moldy areas.  Indoor Mold Control Maintain humidity below 50%. Clean washable surfaces with 5% bleach solution. Remove sources e.g. Contaminated carpets.  Control of Dog or Cat Allergen Avoidance is the best way to manage a dog or cat allergy . If you have a dog or cat and are allergic to dog or cats, consider removing the dog or cat from the home. If you have a dog or cat but don't want to find it a new home, or if your family wants a pet even though someone in the  household is allergic, here are some strategies that may help keep symptoms at bay:  Keep the pet out of your bedroom and restrict it to only a few rooms. Be advised that keeping the dog or cat in only one room will not limit the allergens to that room. Don't pet, hug or kiss the dog or cat; if you do, wash your hands with soap and water. High-efficiency particulate air (HEPA) cleaners run continuously in a bedroom or living room can reduce allergen levels over time. Regular use of a high-efficiency vacuum cleaner or a central vacuum can reduce allergen levels. Giving your dog or cat a bath at least once a week can reduce airborne allergen.  Control of Dust Mite Allergen Dust mites play a major role in allergic asthma and rhinitis. They occur in environments with high humidity wherever human skin is found. Dust mites absorb humidity from the atmosphere (ie, they do not drink) and feed on organic matter (including shed human and animal skin). Dust mites are a microscopic type of insect that you cannot see with the naked eye. High levels of dust mites have been detected from mattresses, pillows, carpets, upholstered furniture, bed covers, clothes, soft toys and any woven material. The principal allergen of the dust mite is found in its feces. A gram of dust may contain 1,000 mites and 250,000 fecal particles. Mite antigen is easily measured in the air during house cleaning activities. Dust mites do not bite and do not cause harm to humans,  other than by triggering allergies/asthma.  Ways to decrease your exposure to dust mites in your home:  1. Encase mattresses, box springs and pillows with a mite-impermeable barrier or cover  2. Wash sheets, blankets and drapes weekly in hot water (130 F) with detergent and dry them in a dryer on the hot setting.  3. Have the room cleaned frequently with a vacuum cleaner and a damp dust-mop. For carpeting or rugs, vacuuming with a vacuum cleaner equipped with a  high-efficiency particulate air (HEPA) filter. The dust mite allergic individual should not be in a room which is being cleaned and should wait 1 hour after cleaning before going into the room.  4. Do not sleep on upholstered furniture (eg, couches).  5. If possible removing carpeting, upholstered furniture and drapery from the home is ideal. Horizontal blinds should be eliminated in the rooms where the person spends the most time (bedroom, study, television room). Washable vinyl, roller-type shades are optimal.  6. Remove all non-washable stuffed toys from the bedroom. Wash stuffed toys weekly like sheets and blankets above.  7. Reduce indoor humidity to less than 50%. Inexpensive humidity monitors can be purchased at most hardware stores. Do not use a humidifier as can make the problem worse and are not recommended.  Allergy  Shots  Allergies are the result of a chain reaction that starts in the immune system. Your immune system controls how your body defends itself. For instance, if you have an allergy  to pollen, your immune system identifies pollen as an invader or allergen. Your immune system overreacts by producing antibodies called Immunoglobulin E (IgE). These antibodies travel to cells that release chemicals, causing an allergic reaction.  The concept behind allergy  immunotherapy, whether it is received in the form of shots or tablets, is that the immune system can be desensitized to specific allergens that trigger allergy  symptoms. Although it requires time and patience, the payback can be long-term relief. Allergy  injections contain a dilute solution of those substances that you are allergic to based upon your skin testing and allergy  history.   How Do Allergy  Shots Work?  Allergy  shots work much like a vaccine. Your body responds to injected amounts of a particular allergen given in increasing doses, eventually developing a resistance and tolerance to it. Allergy  shots can lead to  decreased, minimal or no allergy  symptoms.  There generally are two phases: build-up and maintenance. Build-up often ranges from three to six months and involves receiving injections with increasing amounts of the allergens. The shots are typically given once or twice a week, though more rapid build-up schedules are sometimes used.  The maintenance phase begins when the most effective dose is reached. This dose is different for each person, depending on how allergic you are and your response to the build-up injections. Once the maintenance dose is reached, there are longer periods between injections, typically two to four weeks.  Occasionally doctors give cortisone-type shots that can temporarily reduce allergy  symptoms. These types of shots are different and should not be confused with allergy  immunotherapy shots.  Who Can Be Treated with Allergy  Shots?  Allergy  shots may be a good treatment approach for people with allergic rhinitis (hay fever), allergic asthma, conjunctivitis (eye allergy ) or stinging insect allergy .   Before deciding to begin allergy  shots, you should consider:   The length of allergy  season and the severity of your symptoms  Whether medications and/or changes to your environment can control your symptoms  Your desire to avoid long-term medication  use  Time: allergy  immunotherapy requires a major time commitment  Cost: may vary depending on your insurance coverage  Allergy  shots for children age 57 and older are effective and often well tolerated. They might prevent the onset of new allergen sensitivities or the progression to asthma.  Allergy  shots are not started on patients who are pregnant but can be continued on patients who become pregnant while receiving them. In some patients with other medical conditions or who take certain common medications, allergy  shots may be of risk. It is important to mention other medications you talk to your allergist.   What are the  two types of build-ups offered:   RUSH or Rapid Desensitization -- one day of injections lasting from 8:30-4:30pm, injections every 1 hour.  Approximately half of the build-up process is completed in that one day.  The following week, normal build-up is resumed, and this entails ~16 visits either weekly or twice weekly, until reaching your "maintenance dose" which is continued weekly until eventually getting spaced out to every month for a duration of 3 to 5 years. The regular build-up appointments are nurse visits where the injections are administered, followed by required monitoring for 30 minutes.    Traditional build-up -- weekly visits for 6 -12 months until reaching "maintenance dose", then continue weekly until eventually spacing out to every 4 weeks as above. At these appointments, the injections are administered, followed by required monitoring for 30 minutes.     Either way is acceptable, and both are equally effective. With the rush protocol, the advantage is that less time is spent here for injections overall AND you would also reach maintenance dosing faster (which is when the clinical benefit starts to become more apparent). Not everyone is a candidate for rapid desensitization.   IF we proceed with the RUSH protocol, there are premedications which must be taken the day before and the day after the rush only (this includes antihistamines, steroids, and Singulair ).  After the rush day, no prednisone  or Singulair  is required, and we just recommend antihistamines taken on your injection day.  What Is An Estimate of the Costs?  If you are interested in starting allergy  injections, please check with your insurance company about your coverage for both allergy  vial sets and allergy  injections.  Please do so prior to making the appointment to start injections.  The following are CPT codes to give to your insurance company. These are the amounts we BILL to the insurance company, but the amount YOU  WILL PAY and WE RECEIVE IS SUBSTANTIALLY LESS and depends on the contracts we have with different insurance companies.   Amount Billed to Insurance One allergy  vial set  CPT 95165   $ 1200     Two allergy  vial set  CPT 95165   $ 2400     Three allergy  vial set  CPT 95165   $ 3600     One injection   CPT 95115   $ 35  Two injections   CPT 95117   $ 40 RUSH (Rapid Desensitization) CPT 95180 x 8 hours $500/hour  Regarding the allergy  injections, your co-pay may or may not apply with each injection, so please confirm this with your insurance company. When you start allergy  injections, 1 or 2 sets of vials are made based on your allergies.  Not all patients can be on one set of vials. A set of vials lasts 6 months to a year depending on how quickly you can proceed with  your build-up of your allergy  injections. Vials are personalized for each patient depending on their specific allergens.  How often are allergy  injection given during the build-up period?   Injections are given at least weekly during the build-up period until your maintenance dose is achieved. Per the doctor's discretion, you may have the option of getting allergy  injections two times per week during the build-up period. However, there must be at least 48 hours between injections. The build-up period is usually completed within 6-12 months depending on your ability to schedule injections and for adjustments for reactions. When maintenance dose is reached, your injection schedule is gradually changed to every two weeks and later to every three weeks. Injections will then continue every 4 weeks. Usually, injections are continued for a total of 3-5 years.   When Will I Feel Better?  Some may experience decreased allergy  symptoms during the build-up phase. For others, it may take as long as 12 months on the maintenance dose. If there is no improvement after a year of maintenance, your allergist will discuss other treatment options with  you.  If you aren't responding to allergy  shots, it may be because there is not enough dose of the allergen in your vaccine or there are missing allergens that were not identified during your allergy  testing. Other reasons could be that there are high levels of the allergen in your environment or major exposure to non-allergic triggers like tobacco smoke.  What Is the Length of Treatment?  Once the maintenance dose is reached, allergy  shots are generally continued for three to five years. The decision to stop should be discussed with your allergist at that time. Some people may experience a permanent reduction of allergy  symptoms. Others may relapse and a longer course of allergy  shots can be considered.  What Are the Possible Reactions?  The two types of adverse reactions that can occur with allergy  shots are local and systemic. Common local reactions include very mild redness and swelling at the injection site, which can happen immediately or several hours after. Report a delayed reaction from your last injection. These include arm swelling or runny nose, watery eyes or cough that occurs within 12-24 hours after injection. A systemic reaction, which is less common, affects the entire body or a particular body system. They are usually mild and typically respond quickly to medications. Signs include increased allergy  symptoms such as sneezing, a stuffy nose or hives.   Rarely, a serious systemic reaction called anaphylaxis can develop. Symptoms include swelling in the throat, wheezing, a feeling of tightness in the chest, nausea or dizziness. Most serious systemic reactions develop within 30 minutes of allergy  shots. This is why it is strongly recommended you wait in your doctor's office for 30 minutes after your injections. Your allergist is trained to watch for reactions, and his or her staff is trained and equipped with the proper medications to identify and treat them.   Report to the nurse  immediately if you experience any of the following symptoms: swelling, itching or redness of the skin, hives, watery eyes/nose, breathing difficulty, excessive sneezing, coughing, stomach pain, diarrhea, or light headedness. These symptoms may occur within 15-20 minutes after injection and may require medication.   Who Should Administer Allergy  Shots?  The preferred location for receiving shots is your prescribing allergist's office. Injections can sometimes be given at another facility where the physician and staff are trained to recognize and treat reactions, and have received instructions by your prescribing allergist.  What  if I am late for an injection?   Injection dose will be adjusted depending upon how many days or weeks you are late for your injection.   What if I am sick?   Please report any illness to the nurse before receiving injections. She may adjust your dose or postpone injections depending on your symptoms. If you have fever, flu, sinus infection or chest congestion it is best to postpone allergy  injections until you are better. Never get an allergy  injection if your asthma is causing you problems. If your symptoms persist, seek out medical care to get your health problem under control.  What If I am or Become Pregnant:  Women that become pregnant should schedule an appointment with [facility] before receiving any further allergy  injections.   Wanda Craze, FNP [facility]  

## 2024-04-10 LAB — IGE NUT PROF. W/COMPONENT RFLX

## 2024-04-10 LAB — ALLERGEN, PINEAPPLE, F210: Pineapple IgE: <0.1 kU/L

## 2024-04-12 LAB — IGE NUT PROF. W/COMPONENT RFLX
F017-IgE Hazelnut (Filbert): <0.1 kU/L
F256-IgE Walnut: 0.16 kU/L — AB
Jug R 1 IgE: <0.1 kU/L
Jug R 3 IgE: <0.1 kU/L
Macadamia Nut, IgE: 0.1 kU/L — AB
Macadamia Nut, IgE: 0.11 kU/L — AB
Peanut, IgE: <0.1 kU/L
Pecan Nut IgE: <0.1 kU/L
Pecan Nut IgE: <0.1 kU/L

## 2024-04-12 LAB — PANEL 604721
Jug R 1 IgE: 0.19 kU/L — AB
Jug R 3 IgE: <0.1 kU/L

## 2024-04-12 LAB — ALLERGEN COMPONENT COMMENTS

## 2024-04-14 ENCOUNTER — Encounter: Payer: Self-pay | Admitting: Nurse Practitioner

## 2024-04-19 ENCOUNTER — Ambulatory Visit: Payer: Self-pay | Admitting: Family

## 2024-04-19 NOTE — Progress Notes (Signed)
 Please let  Luke know that her lab work came back.  Walnut, macadamia nut and pistachio were low. 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  For now recommend avoiding all tree nuts and have access to your epinephrine  auto injector device at all times.   Does she eat peanuts or peanut products without any problems?  Pineapple was negative. We could consider an in office oral food challenge to pineapple. If interested would need to bring pineapple with her the day of the challenge. She would need to be off all antihistamines 3 days prior  to this appointment and in good health (no recent vaccines or antibiotics in the past 7 days). This appointment will last 2-4 hours. If not interested in an in office oral challenge you will need to continue to avoid pineapple and have access to your epinephrine  auto injector device.

## 2024-04-25 NOTE — Telephone Encounter (Signed)
 Noted! Thank you

## 2024-05-10 ENCOUNTER — Ambulatory Visit: Payer: Self-pay | Admitting: Nurse Practitioner

## 2024-05-12 ENCOUNTER — Encounter: Payer: Self-pay | Admitting: Gastroenterology

## 2024-05-25 ENCOUNTER — Encounter: Admitting: Nurse Practitioner

## 2024-06-17 ENCOUNTER — Telehealth: Payer: Self-pay | Admitting: *Deleted

## 2024-06-17 ENCOUNTER — Ambulatory Visit

## 2024-06-17 NOTE — Progress Notes (Unsigned)
 SABRA

## 2024-06-17 NOTE — Telephone Encounter (Signed)
 Attempt to reach pt for pre-visit. LM with call back #.  Will attempt to reach again in 5 min due to no other # listed in profile  Second attempt to reach pt for pre-vist unsuccessful. LM with facility # for pt to call back. Instructed pt to call # given by end of the day and reschedule the pre-visit  with RN or the scheduled procedure will be canceled.

## 2024-06-17 NOTE — Telephone Encounter (Signed)
 Patient has been rescheduled for PV on 9/15 at  8:00

## 2024-06-20 ENCOUNTER — Encounter: Payer: Self-pay | Admitting: Gastroenterology

## 2024-06-20 ENCOUNTER — Ambulatory Visit (AMBULATORY_SURGERY_CENTER)

## 2024-06-20 VITALS — Ht 69.0 in | Wt 291.0 lb

## 2024-06-20 DIAGNOSIS — Z1211 Encounter for screening for malignant neoplasm of colon: Secondary | ICD-10-CM

## 2024-06-20 MED ORDER — NA SULFATE-K SULFATE-MG SULF 17.5-3.13-1.6 GM/177ML PO SOLN: 1.0000 | Freq: Once | ORAL | 0 refills | Status: AC

## 2024-06-20 NOTE — Progress Notes (Signed)

## 2024-06-22 ENCOUNTER — Ambulatory Visit (INDEPENDENT_AMBULATORY_CARE_PROVIDER_SITE_OTHER): Admitting: Nurse Practitioner

## 2024-06-22 ENCOUNTER — Ambulatory Visit

## 2024-06-22 ENCOUNTER — Encounter: Payer: Self-pay | Admitting: Nurse Practitioner

## 2024-06-22 VITALS — BP 130/80 | HR 66 | Temp 98.4°F | Ht 69.0 in | Wt 296.2 lb

## 2024-06-22 VITALS — BP 132/82 | HR 66 | Ht 69.0 in | Wt 294.2 lb

## 2024-06-22 DIAGNOSIS — D170 Benign lipomatous neoplasm of skin and subcutaneous tissue of head, face and neck: Secondary | ICD-10-CM

## 2024-06-22 DIAGNOSIS — Z6841 Body Mass Index (BMI) 40.0 and over, adult: Secondary | ICD-10-CM

## 2024-06-22 DIAGNOSIS — Z13220 Encounter for screening for lipoid disorders: Secondary | ICD-10-CM

## 2024-06-22 DIAGNOSIS — J302 Other seasonal allergic rhinitis: Secondary | ICD-10-CM

## 2024-06-22 DIAGNOSIS — I10 Essential (primary) hypertension: Secondary | ICD-10-CM | POA: Diagnosis not present

## 2024-06-22 DIAGNOSIS — Z23 Encounter for immunization: Secondary | ICD-10-CM

## 2024-06-22 DIAGNOSIS — R9431 Abnormal electrocardiogram [ECG] [EKG]: Secondary | ICD-10-CM | POA: Diagnosis not present

## 2024-06-22 DIAGNOSIS — F411 Generalized anxiety disorder: Secondary | ICD-10-CM

## 2024-06-22 DIAGNOSIS — Z139 Encounter for screening, unspecified: Secondary | ICD-10-CM

## 2024-06-22 DIAGNOSIS — E66812 Obesity, class 2: Secondary | ICD-10-CM

## 2024-06-22 DIAGNOSIS — Z0000 Encounter for general adult medical examination without abnormal findings: Secondary | ICD-10-CM

## 2024-06-22 DIAGNOSIS — R7309 Other abnormal glucose: Secondary | ICD-10-CM

## 2024-06-22 DIAGNOSIS — Z6839 Body mass index (BMI) 39.0-39.9, adult: Secondary | ICD-10-CM

## 2024-06-22 DIAGNOSIS — Z136 Encounter for screening for cardiovascular disorders: Secondary | ICD-10-CM

## 2024-06-22 DIAGNOSIS — Z79899 Other long term (current) drug therapy: Secondary | ICD-10-CM

## 2024-06-22 LAB — POCT URINALYSIS DIP (CLINITEK)
Bilirubin, UA: NEGATIVE
Glucose, UA: NEGATIVE mg/dL
Ketones, POC UA: NEGATIVE mg/dL
Leukocytes, UA: NEGATIVE
Nitrite, UA: NEGATIVE
POC PROTEIN,UA: NEGATIVE
Spec Grav, UA: >=1.03 — AB (ref 1.010–1.025)
Urobilinogen, UA: 0.2 U/dL
pH, UA: 6 (ref 5.0–8.0)

## 2024-06-22 NOTE — Progress Notes (Signed)
 Cardiology Office Note Date:  06/22/2024  ID:  Victoria Ewing, DOB Dec 17, 1975, MRN [id number] PCP:  Georgina Speaks, FNP  Cardiologist:  Joelle VEAR Ren Donley, MD  Chief Complaint  Patient presents with   Abnormal ECG     History of Present Illness: Victoria Ewing is a 48 y.o. female who presents for abnormal ECG.  She was at her PCP visit this morning and on screening ECG, she had abnormal findings and referred to cardiology. She denies any CP or dyspnea. She denies any Hx of heart problems. She had lost 50 pounds via an online weight loss program a few years ago but then due to foot surgery and not being able to exercise, she gained about 30 pounds back. She is getting colonoscopy soon and will re-start working out after that.   ROS: Please see the history of present illness. All other systems are reviewed and negative.   Past Medical History:  Diagnosis Date   Allergy     Anxiety    Asthma    Depression    Hypertension     Past Surgical History:  Procedure Laterality Date   BUNIONECTOMY  2023   March - left foot; Nov - right foot   CESAREAN SECTION  01/28/2012   lipoma removal  2019    Current Outpatient Medications  Medication Sig Dispense Refill   Cetirizine  HCl (ZYRTEC  ALLERGY ) 10 MG CAPS Take 1 capsule (10 mg total) by mouth daily as needed (Can take an extra dose during flare ups.). 180 capsule 1   clobetasol ointment (TEMOVATE) 0.05 % Apply 1 Application topically 2 (two) times daily.     EPINEPHrine  (EPIPEN  2-PAK) 0.3 mg/0.3 mL IJ SOAJ injection Inject 0.3 mg into the muscle as needed for anaphylaxis. 2 each 1   escitalopram  (LEXAPRO ) 10 MG tablet Take 1 tablet (10 mg total) by mouth daily. 90 tablet 2   hydrocortisone  2.5 % ointment Apply 1 Application topically 2 (two) times daily.     montelukast  (SINGULAIR ) 10 MG tablet Take 1 tablet (10 mg total) by mouth at bedtime. 90 tablet 1   Olopatadine-Mometasone (RYALTRIS ) 665-25 MCG/ACT SUSP Place 2 sprays  into the nose in the morning and at bedtime. 87 g 1   VITAMIN D, CHOLECALCIFEROL, PO Take 1 capsule by mouth daily.     No current facility-administered medications for this visit.    Allergies:   Justicia adhatoda and Venlafaxine    Social History:  No drinking, smoking or substance use  Family History:  Mother w/ Afib  PHYSICAL EXAM: VS:  BP 132/82 (BP Location: Right Arm, Cuff Size: Large)   Pulse 66   Ht 5' 9 (1.753 m)   Wt 294 lb 3.2 oz (133.4 kg)   LMP 06/21/2024   BMI 43.45 kg/m  , BMI Body mass index is 43.45 kg/m. GEN: Well nourished, well developed, in no acute distress HEENT: normal Neck: no JVD, carotid bruits, or masses Cardiac: RRR; no murmurs, rubs, or gallops,no edema  Respiratory:  CTAB bilaterally, normal work of breathing GI: soft, nontender, nondistended, + BS Extremities: No LE edema Skin: warm and dry, no rash Neuro:  Strength and sensation are intact  EKG: NSR  Recent Labs: 03/03/2024: ALT 12; BUN 12; Creatinine, Ser 0.81; Hemoglobin 11.3; Platelets 316; Potassium 4.0; Sodium 139; TSH 1.060      Component Value Date/Time   CHOL 142 11/04/2019 1059   TRIG 93 11/04/2019 1059   HDL 50 11/04/2019 1059   CHOLHDL 2.8  11/04/2019 1059   CHOLHDL 2.9 08/11/2016 0949   VLDL 25 08/11/2016 0949   LDLCALC 75 11/04/2019 1059     Wt Readings from Last 5 Encounters:  06/22/24 294 lb 3.2 oz (133.4 kg)  06/22/24 296 lb 3.2 oz (134.4 kg)  06/20/24 291 lb (132 kg)  03/03/24 292 lb 12.8 oz (132.8 kg)  02/25/24 289 lb 3.2 oz (131.2 kg)     BP Readings from Last 5 Encounters:  06/22/24 132/82  06/22/24 130/80  03/03/24 (!) 130/90  02/25/24 130/88  01/20/20 136/80    Studies: Reviewed  ASSESSMENT AND PLAN: [name] is a 48 y.o. female who presents for abnormal ECG.  #Abnormal ECG #Obesity - Presenting with abnormal ECG that was due to lead placement. Repeat ECG here w/o any issues. Patient has no symptoms and no Hx of heart disease. - Plan  on re-starting lifestyle modification for weight loss after her colonoscopy    Signed, Joelle VEAR Ren Donley, MD  06/22/2024 2:17 PM    [facility]

## 2024-06-22 NOTE — Progress Notes (Signed)
 LILLETTE Kristeen JINNY Gladis, CMA,acting as a neurosurgeon for Gaines Ada, FNP.,have documented all relevant documentation on the behalf of Gaines Ada, FNP,as directed by  Gaines Ada, FNP while in the presence of Gaines Ada, FNP.  Subjective:    Patient ID: Victoria Ewing , female    DOB: 10-07-1975 , 48 y.o.   MRN: [id number]  Chief Complaint  Patient presents with   Annual Exam    Patient presents today for HM, Patient reports compliance with medication. Patient denies any chest pain, SOB, or headaches. Patient has no concerns today.     HPI Discussed the use of AI scribe software for clinical note transcription with the patient, who gave verbal consent to proceed.  History of Present Illness Victoria Ewing is a 48 year old female who presents for an annual physical exam.  She has not seen any other healthcare providers since her last visit in May, except for an allergist. Her allergies were particularly severe this past spring, exacerbating her asthma for the first time in years, leading to the addition of Singulair  to her treatment regimen.  She has a history of asthma and allergies, managed with Zyrtec  and Singulair  every night. She experiences year-round allergies and uses over-the-counter eye moisture drops for dry eyes. She also performs nasal rinses more frequently due to increased nasal symptoms.  She has a history of hypertension, previously managed with medication during a high-stress period in 2016. She has not been on blood pressure medication since losing weight, although she has regained some weight following foot surgeries.  Her diet is inconsistent, with periods of healthy eating interspersed with stress-induced snacking. She primarily drinks water. She has fallen off her exercise routine and has gained weight after losing 50 pounds in 2023.  She is scheduled for a colonoscopy next week and has been preparing for it. She has also had a mammogram recently and is awaiting  the results.  She has a history of a lipoma resection and is scheduled to see a dermatologist for a nodule that has reappeared. She has not been to the dentist recently and acknowledges the need to schedule an appointment.  Dr. Jon Rummer is her Gyn. Scale at home was 291 lbs. She has a firm nodule to her posterior neck firm area, she had a lipoma resection to right posterior neck.   Caitlin Ri[facility] - [facility] October 6th.      Past Medical History:  Diagnosis Date   Allergy     Anxiety    Asthma    Depression    Hypertension      Family History  Problem Relation Age of Onset   Allergic rhinitis Mother    Cancer Mother        pancreatic   Stroke Mother 32   Diabetes Mother    Kidney disease Mother        transplant in 2010   Hypertension Mother    Arthritis Mother    Depression Mother    Heart disease Mother    Transient ischemic attack Father 38   Cancer Father        prostate cancer?   Arthritis Father    Depression Father    Hypertension Father    Allergic rhinitis Sister    Allergic rhinitis Sister    Hypertension Sister 77   Anxiety disorder Sister    Arthritis Sister    Asthma Sister    Depression Sister    Hypertension Sister    Miscarriages / Stillbirths  Sister    Anxiety disorder Sister    Arthritis Sister    Hypertension Sister    Drug abuse Maternal Uncle    Early death Maternal Uncle    Drug abuse Paternal Uncle    Early death Paternal Uncle    Stroke Maternal Grandmother 47   Alcohol abuse Maternal Grandmother    Depression Maternal Grandmother    Early death Maternal Grandmother    Hypertension Paternal Grandmother    Alcohol abuse Paternal Grandfather    Hypertension Paternal Grandfather    Stroke Cousin 45   Rheum arthritis Cousin    Asthma Other    Colon cancer Neg Hx    Rectal cancer Neg Hx    Stomach cancer Neg Hx    Esophageal cancer Neg Hx      Current Outpatient Medications:    Cetirizine  HCl (ZYRTEC  ALLERGY ) 10 MG CAPS,  Take 1 capsule (10 mg total) by mouth daily as needed (Can take an extra dose during flare ups.)., Disp: 180 capsule, Rfl: 1   clobetasol ointment (TEMOVATE) 0.05 %, Apply 1 Application topically 2 (two) times daily., Disp: , Rfl:    escitalopram  (LEXAPRO ) 10 MG tablet, Take 1 tablet (10 mg total) by mouth daily., Disp: 90 tablet, Rfl: 2   hydrocortisone  2.5 % ointment, Apply 1 Application topically 2 (two) times daily., Disp: , Rfl:    montelukast  (SINGULAIR ) 10 MG tablet, Take 1 tablet (10 mg total) by mouth at bedtime., Disp: 90 tablet, Rfl: 1   Olopatadine-Mometasone (RYALTRIS ) 665-25 MCG/ACT SUSP, Place 2 sprays into the nose in the morning and at bedtime., Disp: 87 g, Rfl: 1   VITAMIN D, CHOLECALCIFEROL, PO, Take 1 capsule by mouth daily., Disp: , Rfl:    EPINEPHrine  (EPIPEN  2-PAK) 0.3 mg/0.3 mL IJ SOAJ injection, Inject 0.3 mg into the muscle as needed for anaphylaxis., Disp: 2 each, Rfl: 1   Allergies  Allergen Reactions   Justicia Adhatoda Shortness Of Breath     Tree nuts   Venlafaxine  Other (See Comments)    Per pt 'medication induced panic attack'      The patient states she uses none for birth control. Patient's last menstrual period was 06/21/2024.. Negative for Dysmenorrhea and Negative for Menorrhagia. Negative for: breast discharge, breast lump(s), breast pain and breast self exam. Associated symptoms include abnormal vaginal bleeding. Pertinent negatives include abnormal bleeding (hematology), anxiety, decreased libido, depression, difficulty falling sleep, dyspareunia, history of infertility, nocturia, sexual dysfunction, sleep disturbances, urinary incontinence, urinary urgency, vaginal discharge and vaginal itching. Diet regular.  The patient states her exercise level is minimal - 1 day a week. She is due to have her colonoscopy next week with Dr. Glendia Holt.   The patient's tobacco use is:  Social History   Tobacco Use  Smoking Status Never   Passive exposure:  Past  Smokeless Tobacco Never   She has been exposed to passive smoke. The patient's alcohol use is:  Social History   Substance and Sexual Activity  Alcohol Use Yes   Comment: Socially    Review of Systems  Constitutional: Negative.  Negative for activity change and fatigue.  Eyes:  Negative for visual disturbance.  Respiratory: Negative.  Negative for choking, shortness of breath and wheezing.   Cardiovascular: Negative.  Negative for chest pain, palpitations and leg swelling.  Gastrointestinal: Negative.   Endocrine: Negative.  Negative for polydipsia, polyphagia and polyuria.  Musculoskeletal: Negative.   Skin: Negative.   Neurological:  Negative for dizziness, weakness and headaches.  Psychiatric/Behavioral:  Negative for confusion. The patient is not nervous/anxious.      Today's Vitals   06/22/24 0854  BP: 130/80  Pulse: 66  Temp: 98.4 F (36.9 C)  TempSrc: Oral  Weight: 296 lb 3.2 oz (134.4 kg)  Height: 5' 9 (1.753 m)  PainSc: 0-No pain   Body mass index is 43.74 kg/m.  Wt Readings from Last 3 Encounters:  06/22/24 294 lb 3.2 oz (133.4 kg)  06/22/24 296 lb 3.2 oz (134.4 kg)  06/20/24 291 lb (132 kg)     Objective:  Physical Exam Vitals and nursing note reviewed.  Constitutional:      General: She is not in acute distress.    Appearance: Normal appearance. She is well-developed. She is obese.  HENT:     Head: Normocephalic and atraumatic.     Right Ear: Hearing, tympanic membrane, ear canal and external ear normal. There is no impacted cerumen.     Left Ear: Hearing, tympanic membrane, ear canal and external ear normal. There is no impacted cerumen.     Nose: Nose normal.     Mouth/Throat:     Mouth: Mucous membranes are moist.  Eyes:     General: Lids are normal.     Extraocular Movements: Extraocular movements intact.     Conjunctiva/sclera: Conjunctivae normal.     Pupils: Pupils are equal, round, and reactive to light.     Funduscopic exam:     Right eye: No papilledema.        Left eye: No papilledema.  Neck:     Thyroid : No thyroid  mass.     Vascular: No carotid bruit.  Cardiovascular:     Rate and Rhythm: Normal rate and regular rhythm.     Pulses: Normal pulses.     Heart sounds: Normal heart sounds. No murmur heard. Pulmonary:     Effort: Pulmonary effort is normal. No respiratory distress.     Breath sounds: Normal breath sounds. No wheezing.  Chest:     Chest wall: No mass.  Breasts:    Tanner Score is 5.     Right: Normal. No mass or tenderness.     Left: Normal. No mass or tenderness.  Abdominal:     General: Abdomen is flat. Bowel sounds are normal. There is no distension.     Palpations: Abdomen is soft.     Tenderness: There is no abdominal tenderness.  Genitourinary:    Rectum: Guaiac result negative.  Musculoskeletal:        General: No swelling. Normal range of motion.     Cervical back: Full passive range of motion without pain, normal range of motion and neck supple.     Right lower leg: No edema.     Left lower leg: No edema.  Lymphadenopathy:     Upper Body:     Right upper body: No supraclavicular, axillary or pectoral adenopathy.     Left upper body: No supraclavicular, axillary or pectoral adenopathy.  Skin:    General: Skin is warm and dry.     Capillary Refill: Capillary refill takes less than 2 seconds.  Neurological:     General: No focal deficit present.     Mental Status: She is alert and oriented to person, place, and time.     Cranial Nerves: No cranial nerve deficit.     Sensory: No sensory deficit.     Motor: No weakness.  Psychiatric:        Mood and Affect:  Mood normal.        Behavior: Behavior normal.        Thought Content: Thought content normal.        Judgment: Judgment normal.      Assessment And Plan:     Encounter for annual health examination  Primary hypertension -     EKG 12-Lead -     POCT URINALYSIS DIP (CLINITEK) -     Microalbumin / creatinine urine  ratio -     CMP14+EGFR -     Ambulatory referral to Cardiology  Abnormal glucose -     Hemoglobin A1c  Generalized anxiety disorder -     Flu vaccine trivalent PF, 6mos and older(Flulaval,Afluria,Fluarix,Fluzone)  Need for influenza vaccination  Class 2 obesity without serious comorbidity with body mass index (BMI) of 39.0 to 39.9 in adult, unspecified obesity type  Other long term (current) drug therapy -     CBC with Differential/Platelet  Encounter for screening -     Hepatitis B surface antibody,qualitative  Encounter for lipid screening for cardiovascular disease -     Lipid panel     Return for 1 year physical, 6 month bp check. Patient was given opportunity to ask questions. Patient verbalized understanding of the plan and was able to repeat key elements of the plan. All questions were answered to their satisfaction.   Gaines Ada, FNP  I, Gaines Ada, FNP, have reviewed all documentation for this visit. The documentation on 06/22/24 for the exam, diagnosis, procedures, and orders are all accurate and complete.

## 2024-06-22 NOTE — Patient Instructions (Signed)
 Medication Instructions:  Your physician recommends that you continue on your current medications as directed. Please refer to the Current Medication list given to you today.  *If you need a refill on your cardiac medications before your next appointment, please call your pharmacy*  Lab Work: None ordered  Testing/Procedures: None ordered  Follow-Up: At [facility], you and your health needs are our priority.  As part of our continuing mission to provide you with exceptional heart care, our providers are all part of one team.  This team includes your primary Cardiologist (physician) and Advanced Practice Providers or APPs (Physician Assistants and Nurse Practitioners) who all work together to provide you with the care you need, when you need it.  Your next appointment:   as needed  Provider:   Dr. Azobou    Thank you for choosing [facility]!!   [phone]

## 2024-06-23 ENCOUNTER — Encounter: Payer: Self-pay | Admitting: Nurse Practitioner

## 2024-06-23 LAB — CBC WITH DIFFERENTIAL/PLATELET
Basophils Absolute: 0.1 x10E3/uL (ref 0.0–0.2)
Basos: 1 %
EOS (ABSOLUTE): 0.4 x10E3/uL (ref 0.0–0.4)
Eos: 9 %
Hematocrit: 37.7 % (ref 34.0–46.6)
Hemoglobin: 10.8 g/dL — ABNORMAL LOW (ref 11.1–15.9)
Immature Grans (Abs): 0 x10E3/uL (ref 0.0–0.1)
Immature Granulocytes: 0 %
Lymphocytes Absolute: 1.5 x10E3/uL (ref 0.7–3.1)
Lymphs: 37 %
MCH: 23.8 pg — ABNORMAL LOW (ref 26.6–33.0)
MCHC: 28.6 g/dL — ABNORMAL LOW (ref 31.5–35.7)
MCV: 83 fL (ref 79–97)
Monocytes Absolute: 0.4 x10E3/uL (ref 0.1–0.9)
Monocytes: 11 %
Neutrophils Absolute: 1.7 x10E3/uL (ref 1.4–7.0)
Neutrophils: 42 %
Platelets: 301 x10E3/uL (ref 150–450)
RBC: 4.53 x10E6/uL (ref 3.77–5.28)
RDW: 15.2 % (ref 11.7–15.4)
WBC: 4.1 x10E3/uL (ref 3.4–10.8)

## 2024-06-23 LAB — CMP14+EGFR
ALT: 10 IU/L (ref 0–32)
AST: 14 IU/L (ref 0–40)
Albumin: 4.1 g/dL (ref 3.9–4.9)
Alkaline Phosphatase: 45 IU/L (ref 41–116)
BUN/Creatinine Ratio: 17 (ref 9–23)
BUN: 14 mg/dL (ref 6–24)
Bilirubin Total: 0.3 mg/dL (ref 0.0–1.2)
CO2: 22 mmol/L (ref 20–29)
Calcium: 8.7 mg/dL (ref 8.7–10.2)
Chloride: 105 mmol/L (ref 96–106)
Creatinine, Ser: 0.84 mg/dL (ref 0.57–1.00)
Globulin, Total: 2.9 g/dL (ref 1.5–4.5)
Glucose: 95 mg/dL (ref 70–99)
Potassium: 4.4 mmol/L (ref 3.5–5.2)
Sodium: 142 mmol/L (ref 134–144)
Total Protein: 7 g/dL (ref 6.0–8.5)
eGFR: 87 mL/min/1.73 (ref >59)

## 2024-06-23 LAB — LIPID PANEL
Chol/HDL Ratio: 2.6 ratio (ref 0.0–4.4)
Cholesterol, Total: 144 mg/dL (ref 100–199)
HDL: 55 mg/dL (ref >39)
LDL Chol Calc (NIH): 78 mg/dL (ref 0–99)
Triglycerides: 51 mg/dL (ref 0–149)
VLDL Cholesterol Cal: 11 mg/dL (ref 5–40)

## 2024-06-23 LAB — MICROALBUMIN / CREATININE URINE RATIO
Creatinine, Urine: 192.3 mg/dL
Microalb/Creat Ratio: 12 mg/g{creat} (ref 0–29)
Microalbumin, Urine: 22.2 ug/mL

## 2024-06-23 LAB — HEMOGLOBIN A1C
Est. average glucose Bld gHb Est-mCnc: 114 mg/dL
Hgb A1c MFr Bld: 5.6 % (ref 4.8–5.6)

## 2024-06-23 LAB — HEPATITIS B SURFACE ANTIBODY,QUALITATIVE: Hep B Surface Ab, Qual: REACTIVE

## 2024-06-30 DIAGNOSIS — Z23 Encounter for immunization: Secondary | ICD-10-CM | POA: Insufficient documentation

## 2024-06-30 DIAGNOSIS — Z6839 Body mass index (BMI) 39.0-39.9, adult: Secondary | ICD-10-CM | POA: Insufficient documentation

## 2024-06-30 DIAGNOSIS — Z0000 Encounter for general adult medical examination without abnormal findings: Secondary | ICD-10-CM | POA: Insufficient documentation

## 2024-06-30 DIAGNOSIS — D170 Benign lipomatous neoplasm of skin and subcutaneous tissue of head, face and neck: Secondary | ICD-10-CM | POA: Insufficient documentation

## 2024-06-30 NOTE — Assessment & Plan Note (Signed)
 Discussed in context of life stressors and medication management. Prefers not to increase medication dosage despite suggestions from others. - Continue current anxiety management plan. - Discuss preference to avoid increasing medication dosage.

## 2024-06-30 NOTE — Assessment & Plan Note (Signed)
 Previously managed with medication during high-stress period. Currently not on antihypertensive medication. Blood pressure at this visit was 130/80 mmHg, borderline. - Monitor blood pressure. - Discuss history of hypertension and current status.

## 2024-06-30 NOTE — Assessment & Plan Note (Signed)
 Well-managed with Zyrtec  and Singulair . Experiences year-round allergies and uses nasal rinses and eye moisture drops as needed. - Continue current allergy  and asthma management. - Encourage use of nasal rinses and eye moisture drops as needed.

## 2024-06-30 NOTE — Assessment & Plan Note (Signed)
 Recurrent lipoma with dermatology appointment scheduled for evaluation. Previous lipoma resection history discussed. - Follow up with dermatology appointment on the sixth.

## 2024-07-01 ENCOUNTER — Ambulatory Visit: Payer: Self-pay | Admitting: Nurse Practitioner

## 2024-07-01 ENCOUNTER — Ambulatory Visit (AMBULATORY_SURGERY_CENTER): Admitting: Gastroenterology

## 2024-07-01 ENCOUNTER — Encounter: Payer: Self-pay | Admitting: Gastroenterology

## 2024-07-01 VITALS — BP 127/94 | HR 56 | Temp 97.9°F | Resp 12 | Ht 68.0 in | Wt 289.0 lb

## 2024-07-01 DIAGNOSIS — Z1211 Encounter for screening for malignant neoplasm of colon: Secondary | ICD-10-CM

## 2024-07-01 DIAGNOSIS — D123 Benign neoplasm of transverse colon: Secondary | ICD-10-CM | POA: Diagnosis not present

## 2024-07-01 DIAGNOSIS — R9431 Abnormal electrocardiogram [ECG] [EKG]: Secondary | ICD-10-CM | POA: Insufficient documentation

## 2024-07-01 MED ORDER — SODIUM CHLORIDE 0.9 % IV SOLN: 500.0000 mL | INTRAVENOUS | Status: DC

## 2024-07-01 NOTE — Assessment & Plan Note (Signed)
 Influenza vaccine administered Encouraged to take Tylenol as needed for fever or muscle aches.

## 2024-07-01 NOTE — Progress Notes (Signed)
 Sedate, gd SR, tolerated procedure well, VSS, report to RN

## 2024-07-01 NOTE — Patient Instructions (Addendum)
Resume previous diet. Continue present medications. Await pathology results. Repeat colonoscopy (date not yet determined) for surveillance based on pathology results.  YOU HAD AN ENDOSCOPIC PROCEDURE TODAY AT THE [facility]:   Refer to the procedure report that was given to you for any specific questions about what was found during the examination.  If the procedure report does not answer your questions, please call your gastroenterologist to clarify.  If you requested that your care partner not be given the details of your procedure findings, then the procedure report has been included in a sealed envelope for you to review at your convenience later.  YOU SHOULD EXPECT: Some feelings of bloating in the abdomen. Passage of more gas than usual.  Walking can help get rid of the air that was put into your GI tract during the procedure and reduce the bloating. If you had a lower endoscopy (such as a colonoscopy or flexible sigmoidoscopy) you may notice spotting of blood in your stool or on the toilet paper. If you underwent a bowel prep for your procedure, you may not have a normal bowel movement for a few days.  Please Note:  You might notice some irritation and congestion in your nose or some drainage.  This is from the oxygen used during your procedure.  There is no need for concern and it should clear up in a day or so.  SYMPTOMS TO REPORT IMMEDIATELY:  Following lower endoscopy (colonoscopy or flexible sigmoidoscopy):  Excessive amounts of blood in the stool  Significant tenderness or worsening of abdominal pains  Swelling of the abdomen that is new, acute  Fever of 100F or higher  For urgent or emergent issues, a gastroenterologist can be reached at any hour by calling [phone]. Do not use MyChart messaging for urgent concerns.    DIET:  We do recommend a small meal at first, but then you may proceed to your regular diet.  Drink plenty of fluids but you should avoid  alcoholic beverages for 24 hours.  ACTIVITY:  You should plan to take it easy for the rest of today and you should NOT DRIVE or use heavy machinery until tomorrow (because of the sedation medicines used during the test).    FOLLOW UP: Our staff will call the number listed on your records the next business day following your procedure.  We will call around 7:15- 8:00 am to check on you and address any questions or concerns that you may have regarding the information given to you following your procedure. If we do not reach you, we will leave a message.     If any biopsies were taken you will be contacted by phone or by letter within the next 1-3 weeks.  Please call us at [phone] if you have not heard about the biopsies in 3 weeks.    SIGNATURES/CONFIDENTIALITY: You and/or your care partner have signed paperwork which will be entered into your electronic medical record.  These signatures attest to the fact that that the information above on your After Visit Summary has been reviewed and is understood.  Full responsibility of the confidentiality of this discharge information lies with you and/or your care-partner. 

## 2024-07-01 NOTE — Assessment & Plan Note (Signed)
 Behavior modifications discussed and diet history reviewed.   Pt will continue to exercise regularly and modify diet with low GI, plant based foods and decrease intake of processed foods.  Recommend intake of daily multivitamin, Vitamin D, and calcium.  Recommend mammogram and colonoscopy (upcoming) for preventive screenings, as well as recommend immunizations that include influenza, TDAP

## 2024-07-01 NOTE — Op Note (Signed)
 [facility] Patient Name: Victoria Ewing Procedure Date: 07/01/2024 11:02 AM MRN: [id number] Endoscopist: Glendia E. Stacia , MD, [id number] Age: 48 Referring MD:  Date of Birth: 1975/11/27 Gender: Female Account #: [id number] Procedure:                Colonoscopy Indications:              Screening for colorectal malignant neoplasm, This                            is the patient's first colonoscopy Medicines:                Monitored Anesthesia Care Procedure:                Pre-Anesthesia Assessment:                           - Prior to the procedure, a History and Physical                            was performed, and patient medications and                            allergies were reviewed. The patient's tolerance of                            previous anesthesia was also reviewed. The risks                            and benefits of the procedure and the sedation                            options and risks were discussed with the patient.                            All questions were answered, and informed consent                            was obtained. Prior Anticoagulants: The patient has                            taken no anticoagulant or antiplatelet agents. ASA                            Grade Assessment: III - A patient with severe                            systemic disease. After reviewing the risks and                            benefits, the patient was deemed in satisfactory                            condition to undergo the procedure.  After obtaining informed consent, the colonoscope                            was passed under direct vision. Throughout the                            procedure, the patient's blood pressure, pulse, and                            oxygen saturations were monitored continuously. The                            Olympus CF-HQ190L ([id number]) Colonoscope was                            introduced  through the anus and advanced to the the                            cecum, identified by appendiceal orifice and                            ileocecal valve. The colonoscopy was performed                            without difficulty. The patient tolerated the                            procedure well. The quality of the bowel                            preparation was good. The ileocecal valve,                            appendiceal orifice, and rectum were photographed.                            The bowel preparation used was SUPREP via split                            dose instruction. Scope In: 11:33:12 AM Scope Out: 11:46:29 AM Scope Withdrawal Time: 0 hours 8 minutes 45 seconds  Total Procedure Duration: 0 hours 13 minutes 17 seconds  Findings:                 The perianal and digital rectal examinations were                            normal. Pertinent negatives include normal                            sphincter tone and no palpable rectal lesions.                           A 5 mm polyp was found in the transverse colon. The  polyp was sessile. The polyp was removed with a                            cold snare. Resection and retrieval were complete.                            Estimated blood loss was minimal.                           The exam was otherwise normal throughout the                            examined colon.                           The retroflexed view of the distal rectum and anal                            verge was normal and showed no anal or rectal                            abnormalities. Complications:            No immediate complications. Estimated Blood Loss:     Estimated blood loss was minimal. Impression:               - One 5 mm polyp in the transverse colon, removed                            with a cold snare. Resected and retrieved.                           - The distal rectum and anal verge are normal on                             retroflexion view. Recommendation:           - Patient has a contact number available for                            emergencies. The signs and symptoms of potential                            delayed complications were discussed with the                            patient. Return to normal activities tomorrow.                            Written discharge instructions were provided to the                            patient.                           - Resume previous diet.                           -  Continue present medications.                           - Await pathology results.                           - Repeat colonoscopy (date not yet determined) for                            surveillance based on pathology results. [facility] E. Stacia, MD 07/01/2024 11:52:43 AM This report has been signed electronically.

## 2024-07-01 NOTE — Progress Notes (Signed)
 Called to room to assist during endoscopic procedure.  Patient ID and intended procedure confirmed with present staff. Received instructions for my participation in the procedure from the performing physician.

## 2024-07-01 NOTE — Assessment & Plan Note (Signed)
 She is encouraged to strive for BMI less than 30 to decrease cardiac risk. Advised to aim for at least 150 minutes of exercise per week.

## 2024-07-01 NOTE — Assessment & Plan Note (Signed)
 Monitoring of glucose levels necessary due to abnormal glucose history. - Check A1c levels.

## 2024-07-01 NOTE — Progress Notes (Signed)
 [facility] History and Physical   Primary Care Physician:  Georgina Speaks, FNP   Reason for Procedure:   Colon cancer screening  Plan:    Screening colonoscopy     HPI: Victoria Ewing is a 48 y.o. female undergoing initial average risk screening colonoscopy.  She has no family history of colon cancer and no chronic GI symptoms.    Past Medical History:  Diagnosis Date   Allergy     Anxiety    Asthma    Depression    Hypertension     Past Surgical History:  Procedure Laterality Date   BUNIONECTOMY  2023   March - left foot; Nov - right foot   CESAREAN SECTION  01/28/2012   lipoma removal  2019    Prior to Admission medications   Medication Sig Start Date End Date Taking? Authorizing Provider  Cetirizine  HCl (ZYRTEC  ALLERGY ) 10 MG CAPS Take 1 capsule (10 mg total) by mouth daily as needed (Can take an extra dose during flare ups.). 02/25/24  Yes Iva Marty Saltness, MD  escitalopram  (LEXAPRO ) 10 MG tablet Take 1 tablet (10 mg total) by mouth daily. 03/03/24  Yes Georgina Speaks, FNP  montelukast  (SINGULAIR ) 10 MG tablet Take 1 tablet (10 mg total) by mouth at bedtime. 02/25/24 07/01/24 Yes Iva Marty Saltness, MD  VITAMIN D, CHOLECALCIFEROL, PO Take 1 capsule by mouth daily.   Yes [provider]  clobetasol ointment (TEMOVATE) 0.05 % Apply 1 Application topically 2 (two) times daily. 06/02/23   [provider]  EPINEPHrine  (EPIPEN  2-PAK) 0.3 mg/0.3 mL IJ SOAJ injection Inject 0.3 mg into the muscle as needed for anaphylaxis. 04/06/24   Cheryl Reusing, FNP  hydrocortisone  2.5 % ointment Apply 1 Application topically 2 (two) times daily. 06/02/23   [provider]  Olopatadine-Mometasone (RYALTRIS ) 665-25 MCG/ACT SUSP Place 2 sprays into the nose in the morning and at bedtime. 02/25/24   Iva Marty Saltness, MD    Current Outpatient Medications  Medication Sig Dispense Refill   Cetirizine  HCl (ZYRTEC  ALLERGY ) 10 MG CAPS Take 1 capsule  (10 mg total) by mouth daily as needed (Can take an extra dose during flare ups.). 180 capsule 1   escitalopram  (LEXAPRO ) 10 MG tablet Take 1 tablet (10 mg total) by mouth daily. 90 tablet 2   montelukast  (SINGULAIR ) 10 MG tablet Take 1 tablet (10 mg total) by mouth at bedtime. 90 tablet 1   VITAMIN D, CHOLECALCIFEROL, PO Take 1 capsule by mouth daily.     clobetasol ointment (TEMOVATE) 0.05 % Apply 1 Application topically 2 (two) times daily.     EPINEPHrine  (EPIPEN  2-PAK) 0.3 mg/0.3 mL IJ SOAJ injection Inject 0.3 mg into the muscle as needed for anaphylaxis. 2 each 1   hydrocortisone  2.5 % ointment Apply 1 Application topically 2 (two) times daily.     Olopatadine-Mometasone (RYALTRIS ) 665-25 MCG/ACT SUSP Place 2 sprays into the nose in the morning and at bedtime. 87 g 1   Current Facility-Administered Medications  Medication Dose Route Frequency Provider Last Rate Last Admin   0.9 %  sodium chloride  infusion  500 mL Intravenous Continuous Stacia Glendia BRAVO, MD        Allergies as of 07/01/2024 - Review Complete 07/01/2024  Allergen Reaction Noted   Justicia adhatoda Shortness Of Breath 08/09/2021   Venlafaxine  Other (See Comments) 06/04/2018    Family History  Problem Relation Age of Onset   Allergic rhinitis Mother    Cancer Mother  pancreatic   Stroke Mother 66   Diabetes Mother    Kidney disease Mother        transplant in 2010   Hypertension Mother    Arthritis Mother    Depression Mother    Heart disease Mother    Transient ischemic attack Father 20   Cancer Father        prostate cancer?   Arthritis Father    Depression Father    Hypertension Father    Allergic rhinitis Sister    Allergic rhinitis Sister    Hypertension Sister 15   Anxiety disorder Sister    Arthritis Sister    Asthma Sister    Depression Sister    Hypertension Sister    Miscarriages / Stillbirths Sister    Anxiety disorder Sister    Arthritis Sister    Hypertension Sister    Drug  abuse Maternal Uncle    Early death Maternal Uncle    Drug abuse Paternal Uncle    Early death Paternal Uncle    Stroke Maternal Grandmother 47   Alcohol abuse Maternal Grandmother    Depression Maternal Grandmother    Early death Maternal Grandmother    Hypertension Paternal Grandmother    Alcohol abuse Paternal Grandfather    Hypertension Paternal Grandfather    Stroke Cousin 45   Rheum arthritis Cousin    Asthma Other    Colon cancer Neg Hx    Rectal cancer Neg Hx    Stomach cancer Neg Hx    Esophageal cancer Neg Hx     Social History   Socioeconomic History   Marital status: Single    Spouse name: Not on file   Number of children: Not on file   Years of education: Not on file   Highest education level: Master's degree (e.g., MA, MS, MEng, MEd, MSW, MBA)  Occupational History   Not on file  Tobacco Use   Smoking status: Never    Passive exposure: Past   Smokeless tobacco: Never  Vaping Use   Vaping status: Never Used  Substance and Sexual Activity   Alcohol use: Yes    Comment: Socially   Drug use: No   Sexual activity: Not Currently    Birth control/protection: Condom  Other Topics Concern   Not on file  Social History Narrative   Not on file   Social Drivers of Health   Financial Resource Strain: Low Risk  (03/02/2024)   Overall Financial Resource Strain (CARDIA)    Difficulty of Paying Living Expenses: Not very hard  Food Insecurity: No Food Insecurity (03/02/2024)   Hunger Vital Sign    Worried About Running Out of Food in the Last Year: Never true    Ran Out of Food in the Last Year: Never true  Transportation Needs: Unknown (03/02/2024)   PRAPARE - Transportation    Lack of Transportation (Medical): No    Lack of Transportation (Non-Medical): Patient declined  Physical Activity: Insufficiently Active (03/02/2024)   Exercise Vital Sign    Days of Exercise per Week: 1 day    Minutes of Exercise per Session: 30 min  Stress: Stress Concern Present  (03/02/2024)   [employer] of Occupational Health - Occupational Stress Questionnaire    Feeling of Stress : To some extent  Social Connections: Moderately Integrated (03/02/2024)   Social Connection and Isolation Panel    Frequency of Communication with Friends and Family: More than three times a week    Frequency of Social Gatherings with  Friends and Family: Once a week    Attends Religious Services: More than 4 times per year    Active Member of [employer] or Organizations: Yes    Attends Banker Meetings: 1 to 4 times per year    Marital Status: Never married  Intimate Partner Violence: Not At Risk (03/07/2023)   Received from [facility]   HITS    Over the last 12 months how often did your partner physically hurt you?: Never    Over the last 12 months how often did your partner insult you or talk down to you?: Never    Over the last 12 months how often did your partner threaten you with physical harm?: Never    Over the last 12 months how often did your partner scream or curse at you?: Never    Review of Systems:  All other review of systems negative except as mentioned in the HPI.  Physical Exam: Vital signs BP (!) 147/86   Pulse 64   Temp 97.9 F (36.6 C)   Ht 5' 8 (1.727 m)   Wt 289 lb (131.1 kg)   LMP 06/21/2024   SpO2 97%   BMI 43.94 kg/m   General:   Alert,  Well-developed, well-nourished, pleasant and cooperative in NAD Airway:  Mallampati 1 Lungs:  Clear throughout to auscultation.   Heart:  Regular rate and rhythm; no murmurs, clicks, rubs,  or gallops. Abdomen:  Soft, nontender and nondistended. Normal bowel sounds.   Neuro/Psych:  Normal mood and affect. A and O x 3   Britzy Graul E. Stacia, MD [facility]

## 2024-07-01 NOTE — Assessment & Plan Note (Signed)
 EKG shows abnormality with a notch indicating potential heart rhythm glitch. No urgent concerns, but cardiology referral advised. Discussed potential risk factors due to gender and ethnicity. - Refer to cardiology for further evaluation. - Provide EKG copy for colonoscopy team.

## 2024-07-01 NOTE — Progress Notes (Signed)
Pt states no changes to health hx since previsit

## 2024-07-04 ENCOUNTER — Telehealth: Payer: Self-pay | Admitting: *Deleted

## 2024-07-04 NOTE — Telephone Encounter (Signed)
  Follow up Call-     07/01/2024   10:46 AM  Call back number  Post procedure Call Back phone  # [phone]  Permission to leave phone message Yes     Patient questions: Message left to call us  if necessary.

## 2024-07-06 LAB — SURGICAL PATHOLOGY

## 2024-07-09 ENCOUNTER — Ambulatory Visit: Payer: Self-pay | Admitting: Gastroenterology

## 2024-07-09 NOTE — Progress Notes (Signed)
 Victoria Ewing,  The polyp which I removed during your recent procedure was proven to be completely benign but is considered a pre-cancerous polyp that MAY have grown into cancer if it had not been removed.  Studies shows that at least 20% of women over age 46 and 30% of men over age 72 have pre-cancerous polyps.  Based on current nationally recognized surveillance guidelines, I recommend that you have a repeat colonoscopy in 7 years.   If you develop any new rectal bleeding, abdominal pain or significant bowel habit changes, please contact me before then.

## 2024-09-01 ENCOUNTER — Other Ambulatory Visit: Payer: Self-pay | Admitting: Allergy & Immunology

## 2024-09-13 ENCOUNTER — Other Ambulatory Visit: Payer: Self-pay | Admitting: Allergy & Immunology

## 2024-09-14 ENCOUNTER — Other Ambulatory Visit: Payer: Self-pay | Admitting: Allergy & Immunology

## 2024-09-14 ENCOUNTER — Telehealth: Payer: Self-pay | Admitting: Allergy & Immunology

## 2024-09-14 NOTE — Telephone Encounter (Signed)
 Pt called stating that she called the pharmacy and needed a refill on montelukast  (SINGULAIR ) 10 MG tablet [[id number]]

## 2024-09-15 MED ORDER — MONTELUKAST SODIUM 10 MG PO TABS: 10.0000 mg | ORAL_TABLET | Freq: Every day | ORAL | 1 refills | Status: AC

## 2024-09-15 NOTE — Telephone Encounter (Signed)
 I called and scheduled patient for follow up. Patient was sent in 60 day supply of medication. Must keep appointment.

## 2024-10-25 ENCOUNTER — Ambulatory Visit: Admitting: Allergy & Immunology

## 2024-12-22 ENCOUNTER — Ambulatory Visit: Payer: Self-pay | Admitting: Nurse Practitioner

## 2025-06-29 ENCOUNTER — Encounter: Payer: Self-pay | Admitting: Nurse Practitioner
